# Patient Record
Sex: Male | Born: 1985 | Race: White | Hispanic: No | Marital: Single | Smoking: Current every day smoker
Health system: Southern US, Community
[De-identification: ages and names within clinical notes are randomized; demographics above are authoritative.]

## PROBLEM LIST (undated history)

## (undated) DIAGNOSIS — K229 Disease of esophagus, unspecified: Secondary | ICD-10-CM

## (undated) DIAGNOSIS — K2270 Barrett's esophagus without dysplasia: Secondary | ICD-10-CM

## (undated) DIAGNOSIS — R569 Unspecified convulsions: Secondary | ICD-10-CM

## (undated) DIAGNOSIS — F319 Bipolar disorder, unspecified: Secondary | ICD-10-CM

## (undated) DIAGNOSIS — K449 Diaphragmatic hernia without obstruction or gangrene: Secondary | ICD-10-CM

## (undated) DIAGNOSIS — IMO0002 Reserved for concepts with insufficient information to code with codable children: Secondary | ICD-10-CM

## (undated) HISTORY — PX: KNEE SURGERY: SHX244

## (undated) HISTORY — PX: ESOPHAGOSCOPY: SUR460

---
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
## 2005-02-11 ENCOUNTER — Emergency Department (HOSPITAL_COMMUNITY): Admission: EM | Admit: 2005-02-11 | Discharge: 2005-02-11 | Payer: Self-pay | Admitting: Emergency Medicine

## 2006-07-11 ENCOUNTER — Emergency Department (HOSPITAL_COMMUNITY): Admission: EM | Admit: 2006-07-11 | Discharge: 2006-07-11 | Payer: Self-pay | Admitting: Emergency Medicine

## 2006-11-09 ENCOUNTER — Ambulatory Visit (HOSPITAL_COMMUNITY): Payer: Self-pay | Admitting: Psychiatry

## 2006-11-29 ENCOUNTER — Ambulatory Visit (HOSPITAL_COMMUNITY): Payer: Self-pay | Admitting: Psychiatry

## 2006-12-11 ENCOUNTER — Ambulatory Visit (HOSPITAL_COMMUNITY): Payer: Self-pay | Admitting: Psychiatry

## 2007-01-02 ENCOUNTER — Ambulatory Visit (HOSPITAL_COMMUNITY): Payer: Self-pay | Admitting: Psychiatry

## 2007-01-12 ENCOUNTER — Emergency Department (HOSPITAL_COMMUNITY): Admission: EM | Admit: 2007-01-12 | Discharge: 2007-01-12 | Payer: Self-pay | Admitting: Emergency Medicine

## 2007-02-28 ENCOUNTER — Ambulatory Visit (HOSPITAL_COMMUNITY): Payer: Self-pay | Admitting: Psychiatry

## 2007-03-28 ENCOUNTER — Ambulatory Visit (HOSPITAL_COMMUNITY): Payer: Self-pay | Admitting: Psychiatry

## 2007-07-09 ENCOUNTER — Telehealth: Payer: Self-pay | Admitting: Gastroenterology

## 2007-07-11 ENCOUNTER — Emergency Department (HOSPITAL_COMMUNITY): Admission: EM | Admit: 2007-07-11 | Discharge: 2007-07-11 | Payer: Self-pay | Admitting: Emergency Medicine

## 2007-08-15 ENCOUNTER — Ambulatory Visit (HOSPITAL_COMMUNITY): Payer: Self-pay | Admitting: Psychiatry

## 2008-01-27 ENCOUNTER — Encounter: Admission: RE | Admit: 2008-01-27 | Discharge: 2008-01-27 | Payer: Self-pay | Admitting: Family Medicine

## 2008-04-08 ENCOUNTER — Emergency Department (HOSPITAL_COMMUNITY): Admission: EM | Admit: 2008-04-08 | Discharge: 2008-04-08 | Payer: Self-pay | Admitting: Emergency Medicine

## 2009-01-04 ENCOUNTER — Ambulatory Visit: Payer: Self-pay | Admitting: Diagnostic Radiology

## 2009-01-04 ENCOUNTER — Emergency Department (HOSPITAL_BASED_OUTPATIENT_CLINIC_OR_DEPARTMENT_OTHER): Admission: EM | Admit: 2009-01-04 | Discharge: 2009-01-04 | Payer: Self-pay | Admitting: Emergency Medicine

## 2009-01-13 ENCOUNTER — Encounter: Admission: RE | Admit: 2009-01-13 | Discharge: 2009-01-13 | Payer: Self-pay | Admitting: Family Medicine

## 2009-05-25 ENCOUNTER — Ambulatory Visit (HOSPITAL_COMMUNITY): Payer: Self-pay | Admitting: Psychiatry

## 2009-07-30 ENCOUNTER — Encounter: Admission: RE | Admit: 2009-07-30 | Discharge: 2009-07-30 | Payer: Self-pay | Admitting: Gastroenterology

## 2009-12-15 ENCOUNTER — Ambulatory Visit (HOSPITAL_COMMUNITY): Payer: Self-pay | Admitting: Psychiatry

## 2010-02-27 ENCOUNTER — Encounter: Payer: Self-pay | Admitting: Family Medicine

## 2010-03-10 NOTE — Progress Notes (Signed)
Summary: TRIAGE  Phone Note From Other Clinic Call back at [phone]   Caller: GLORIA DR [facility] Call For: ANY [facility] Reason for Call: Schedule Patient Appt Summary of Call: PT HAVING A LOT OF EPIGASTRIC PAIN AND DYSPHAGIA ONLY ABLE TO SWALLOW A LITTLE BIT OF WATER  DR SHAW WOULD LIKE HIM SEEN SOONER THAN 1ST AVAIL.Marland KitchenSCHEDULED HIM FOR 6-24 W/ DR Russella Dar... Initial call taken by: Tawni Levy,  July 09, 2007 9:08 AM  Follow-up for Phone Call        Called pt and he is having difficulty swallowing some foods which feels like some things gets stuck in his throat.  Dr Clelia Croft did start him on Nexium yesterday 07-08-07.  Pt also has some epigastric discomfort.  He doesn't know our doctors here so he really doesn't have a preference.  I told him even though he has an appt we made for him for 07-31-07 w/ Dr Russella Dar I would see if I could get him in any sooner.  Until  I get back to him with sooner appt, I  advised he cuts his meat in smaller peices and chews his food longer before swallowing. I suggested soft foods opposed to hard crunchy foods.  Lowry Ram CMA   Additional Follow-up for Phone Call Additional follow up Details #1::        Pt scheduled for appt 07-17-07 2:30 with Dr Russella Dar Additional Follow-up by: Darcey Nora RN,  July 09, 2007 2:00 PM

## 2010-05-19 LAB — POCT I-STAT, CHEM 8
BUN: 10 mg/dL (ref 6–23)
Calcium, Ion: 1.12 mmol/L (ref 1.12–1.32)
Chloride: 106 mEq/L (ref 96–112)
Creatinine, Ser: 1.3 mg/dL (ref 0.4–1.5)
Glucose, Bld: 103 mg/dL — ABNORMAL HIGH (ref 70–99)
HCT: 48 % (ref 39.0–52.0)
Hemoglobin: 16.3 g/dL (ref 13.0–17.0)
Potassium: 3.8 mEq/L (ref 3.5–5.1)
Sodium: 140 mEq/L (ref 135–145)
TCO2: 24 mmol/L (ref 0–100)

## 2010-06-07 ENCOUNTER — Encounter (HOSPITAL_COMMUNITY): Payer: Self-pay | Admitting: Physician Assistant

## 2010-06-21 NOTE — Consult Note (Signed)
NAMESTEPHFON, BOVEY NO.:  [id number]   MEDICAL RECORD NO.:  [id number]          PATIENT TYPE:  EMS   LOCATION:  ED                           FACILITY:  [facility]   PHYSICIAN:  Madelynn Done, MD  DATE OF BIRTH:  [birth year]   DATE OF CONSULTATION:  07/11/2006  DATE OF DISCHARGE:                                 CONSULTATION   REASON FOR CONSULTATION:  Right hand injury.   REQUESTING PHYSICIAN:  ER doctor, Shelda Jakes, M.D.   HISTORY OF PRESENT ILLNESS:  Mr. Prusinski is a 25 year old ambidextrous  white male who on a stationary object sustained an injury to his right  hand.  The patient presented to the emergency department at [facility]  with pain and some deformity to the right hand.  I was consulted for the  management of his right hand injury.  The patient complained of pain in  the right hand, finger, and small finger and decreased mobility in the  two digits.   PAST MEDICAL HISTORY:  Bipolar disorder.   PAST SURGICAL HISTORY:  Three surgeries on the right knee by Dr. August Saucer at  [facility].   MEDICATIONS:  He denies any.   ALLERGIES:  None.   SOCIAL HISTORY:  He does smoke.  He denies any alcohol use.  Occasional  marijuana.  He works at a [employer].  [place]   REVIEW OF SYSTEMS:  No recent illnesses.  No hospitalization.   PHYSICAL EXAMINATION:  GENERAL:  He is a healthy-appearing white male in  no acute distress.  VITAL SIGNS:  Temperature 97.1, heart rate 64, blood pressure 113/74.  EXTREMITIES:  On examination of the right hand, he has a small abrasion  between the webspace of his long finger and ring finger.  There are no  open wounds on the dorsum or the volar aspect of the hand.  He has an  obvious deformity at the base of his small finger and long finger  metacarpal.  He has decreased motion of those two digits.  He has full  motion and no tenderness over the index and long finger  metacarpal.  He  is able to make the a-okay.  He is able to cross his fingers.  He is  able to extend his thumb from a palm flat position.  His hand is well  perfused with good radial pulse.  Sensation to light touch is present to  the tips  in all digits.  Normal muscle tone in the hand.  No other  areas of trauma to the upper extremity.   On exam radiograph, AP, lateral and oblique films of the right hand do  show the dislocation to the fourth and fifth CMP joint.  It does appear  there is a fracture at the base, what appears to be the fourth  metacarpal.   PROCEDURE:  The right hand was prepped with alcohol, 10 cc of 1%  lidocaine without epi was then used to anesthetize the skin.  An IV  Dilaudid medication was used for pain and  using the finger trap  traction, 10 pounds, and then just a manual closed reduction was  performed with an audible clunk with reduction.  A well-padded clam-  digger type of splint was then applied to the hand.  It was well molded  with a good dorsal mold over the base of the small finger and ring  finger CMC joints.  The patient tolerated the procedure well.   Post-reduction radiographs do show the reduction of the fourth and fifth  CMC joints.  There appears to be a fracture at the base of the ring  finger metacarpals.  It does not appear displaced after the reduction.   IMPRESSION:  Right fourth and fifth carpal metacarpal joint dislocation  to the fracture of the base of the fourth metacarpal.   PLAN:  We will continue to treat this in a closed fashion.  We talked  about the stability for the potential for instability of this injury and  if he does redislocate or there is evidence of subluxation clinically,  he may require open reduction versus percutaneous skeletal fixation.  I  plan to see him back in one week.  At the one week mark, we will x-ray  him in the splint.  If he maintains the reduction, we will continue with  the closed treatment.  The  patient's questions were answered.  He was  given a prescription for Percocet.  This plan was discussed with the  patient's mother, who was present for the entirety.  All questions were  addressed today to keep his hand elevated and do not remove the splint.      Madelynn Done, MD  Electronically Signed     FWO/MEDQ  D:  07/11/2006  T:  07/12/2006  Job:  [id number]

## 2010-07-12 ENCOUNTER — Encounter (HOSPITAL_COMMUNITY): Payer: BC Managed Care – HMO | Admitting: Physician Assistant

## 2010-07-12 DIAGNOSIS — F39 Unspecified mood [affective] disorder: Secondary | ICD-10-CM

## 2010-08-12 ENCOUNTER — Emergency Department (HOSPITAL_COMMUNITY)
Admission: EM | Admit: 2010-08-12 | Discharge: 2010-08-12 | Disposition: A | Discharge status: Home / Self Care | Payer: BC Managed Care – HMO | Attending: Emergency Medicine | Admitting: Emergency Medicine

## 2010-08-12 ENCOUNTER — Emergency Department (HOSPITAL_COMMUNITY): Payer: BC Managed Care – HMO

## 2010-08-12 DIAGNOSIS — F17200 Nicotine dependence, unspecified, uncomplicated: Secondary | ICD-10-CM | POA: Insufficient documentation

## 2010-08-12 DIAGNOSIS — F319 Bipolar disorder, unspecified: Secondary | ICD-10-CM | POA: Insufficient documentation

## 2010-08-12 DIAGNOSIS — R1011 Right upper quadrant pain: Secondary | ICD-10-CM | POA: Insufficient documentation

## 2010-08-12 DIAGNOSIS — G40909 Epilepsy, unspecified, not intractable, without status epilepticus: Secondary | ICD-10-CM | POA: Insufficient documentation

## 2010-08-12 DIAGNOSIS — K219 Gastro-esophageal reflux disease without esophagitis: Secondary | ICD-10-CM | POA: Insufficient documentation

## 2010-08-12 DIAGNOSIS — F209 Schizophrenia, unspecified: Secondary | ICD-10-CM | POA: Insufficient documentation

## 2010-08-12 DIAGNOSIS — R1110 Vomiting, unspecified: Secondary | ICD-10-CM | POA: Insufficient documentation

## 2010-08-12 DIAGNOSIS — K2970 Gastritis, unspecified, without bleeding: Secondary | ICD-10-CM | POA: Insufficient documentation

## 2010-08-12 DIAGNOSIS — Z79899 Other long term (current) drug therapy: Secondary | ICD-10-CM | POA: Insufficient documentation

## 2010-08-12 DIAGNOSIS — K2990 Gastroduodenitis, unspecified, without bleeding: Secondary | ICD-10-CM | POA: Insufficient documentation

## 2010-08-12 LAB — CBC
HCT: 42.9 % (ref 39.0–52.0)
Hemoglobin: 15.4 g/dL (ref 13.0–17.0)
MCH: 30.6 pg (ref 26.0–34.0)
MCHC: 35.9 g/dL (ref 30.0–36.0)
MCV: 85.3 fL (ref 78.0–100.0)
Platelets: 260 10*3/uL (ref 150–400)
RBC: 5.03 MIL/uL (ref 4.22–5.81)
RDW: 12.3 % (ref 11.5–15.5)
WBC: 10.2 10*3/uL (ref 4.0–10.5)

## 2010-08-12 LAB — URINALYSIS, ROUTINE W REFLEX MICROSCOPIC
Bilirubin Urine: NEGATIVE
Glucose, UA: NEGATIVE mg/dL
Hgb urine dipstick: NEGATIVE
Ketones, ur: NEGATIVE mg/dL
Leukocytes, UA: NEGATIVE
Nitrite: NEGATIVE
Protein, ur: NEGATIVE mg/dL
Specific Gravity, Urine: 1.014 (ref 1.005–1.030)
Urobilinogen, UA: 1 mg/dL (ref 0.0–1.0)
pH: 6 (ref 5.0–8.0)

## 2010-08-12 LAB — DIFFERENTIAL
Basophils Absolute: 0.1 10*3/uL (ref 0.0–0.1)
Basophils Relative: 1 % (ref 0–1)
Eosinophils Absolute: 0.2 10*3/uL (ref 0.0–0.7)
Eosinophils Relative: 2 % (ref 0–5)
Lymphocytes Relative: 27 % (ref 12–46)
Lymphs Abs: 2.8 10*3/uL (ref 0.7–4.0)
Monocytes Absolute: 0.7 10*3/uL (ref 0.1–1.0)
Monocytes Relative: 7 % (ref 3–12)
Neutro Abs: 6.5 10*3/uL (ref 1.7–7.7)
Neutrophils Relative %: 63 % (ref 43–77)

## 2010-08-12 LAB — COMPREHENSIVE METABOLIC PANEL
ALT: 16 U/L (ref 0–53)
AST: 18 U/L (ref 0–37)
Albumin: 3.8 g/dL (ref 3.5–5.2)
Alkaline Phosphatase: 63 U/L (ref 39–117)
BUN: 13 mg/dL (ref 6–23)
CO2: 22 mEq/L (ref 19–32)
Calcium: 9.2 mg/dL (ref 8.4–10.5)
Chloride: 107 mEq/L (ref 96–112)
Creatinine, Ser: 0.96 mg/dL (ref 0.50–1.35)
GFR calc Af Amer: >60 mL/min (ref >60)
GFR calc non Af Amer: >60 mL/min (ref >60)
Glucose, Bld: 105 mg/dL — ABNORMAL HIGH (ref 70–99)
Potassium: 3.4 mEq/L — ABNORMAL LOW (ref 3.5–5.1)
Sodium: 139 mEq/L (ref 135–145)
Total Bilirubin: 0.4 mg/dL (ref 0.3–1.2)
Total Protein: 7 g/dL (ref 6.0–8.3)

## 2010-08-12 LAB — LIPASE, BLOOD: Lipase: 24 U/L (ref 11–59)

## 2010-08-18 ENCOUNTER — Encounter (HOSPITAL_COMMUNITY): Payer: BC Managed Care – HMO | Admitting: Physician Assistant

## 2010-08-18 DIAGNOSIS — F309 Manic episode, unspecified: Secondary | ICD-10-CM

## 2010-09-06 ENCOUNTER — Encounter (HOSPITAL_COMMUNITY): Payer: BC Managed Care – HMO | Admitting: Physician Assistant

## 2010-09-06 DIAGNOSIS — F39 Unspecified mood [affective] disorder: Secondary | ICD-10-CM

## 2010-09-06 DIAGNOSIS — F29 Unspecified psychosis not due to a substance or known physiological condition: Secondary | ICD-10-CM

## 2010-10-05 ENCOUNTER — Encounter (HOSPITAL_COMMUNITY): Payer: BC Managed Care – HMO | Admitting: Physician Assistant

## 2010-11-03 LAB — COMPREHENSIVE METABOLIC PANEL
ALT: 20
AST: 25
Albumin: 3.3 — ABNORMAL LOW
Alkaline Phosphatase: 58
BUN: 14
CO2: 23
Calcium: 9.1
Chloride: 103
Creatinine, Ser: 1.17
GFR calc Af Amer: >60
GFR calc non Af Amer: >60
Glucose, Bld: 97
Potassium: 4.1
Sodium: 137
Total Bilirubin: 1
Total Protein: 6.6

## 2010-11-03 LAB — URINALYSIS, ROUTINE W REFLEX MICROSCOPIC
Bilirubin Urine: NEGATIVE
Glucose, UA: NEGATIVE
Hgb urine dipstick: NEGATIVE
Ketones, ur: 40 — AB
Nitrite: NEGATIVE
Protein, ur: NEGATIVE
Specific Gravity, Urine: 1.025
Urobilinogen, UA: 0.2
pH: 5

## 2010-11-03 LAB — CBC
HCT: 46.2
Hemoglobin: 16
MCHC: 34.7
MCV: 87.3
Platelets: 219
RBC: 5.29
RDW: 12
WBC: 10.1

## 2010-11-03 LAB — DIFFERENTIAL
Basophils Absolute: 0
Basophils Relative: 0
Eosinophils Absolute: 0.2
Eosinophils Relative: 3
Lymphocytes Relative: 15
Lymphs Abs: 1.5
Monocytes Absolute: 1
Monocytes Relative: 10
Neutro Abs: 7.3
Neutrophils Relative %: 72

## 2010-11-03 LAB — LIPASE, BLOOD: Lipase: 18

## 2010-11-14 LAB — DIFFERENTIAL
Basophils Absolute: 0.1
Basophils Relative: 0
Eosinophils Absolute: 0 — ABNORMAL LOW
Eosinophils Relative: 0
Lymphocytes Relative: 10 — ABNORMAL LOW
Lymphs Abs: 1.8
Monocytes Absolute: 0.9
Monocytes Relative: 5
Neutro Abs: 16.4 — ABNORMAL HIGH
Neutrophils Relative %: 86 — ABNORMAL HIGH

## 2010-11-14 LAB — URINALYSIS, ROUTINE W REFLEX MICROSCOPIC
Bilirubin Urine: NEGATIVE
Glucose, UA: NEGATIVE
Hgb urine dipstick: NEGATIVE
Ketones, ur: NEGATIVE
Leukocytes, UA: NEGATIVE
Nitrite: NEGATIVE
Protein, ur: 30 — AB
Specific Gravity, Urine: 1.019
Urobilinogen, UA: 0.2
pH: 5.5

## 2010-11-14 LAB — RAPID URINE DRUG SCREEN, HOSP PERFORMED
Amphetamines: NOT DETECTED
Barbiturates: NOT DETECTED
Benzodiazepines: NOT DETECTED
Cocaine: NOT DETECTED
Opiates: NOT DETECTED
Tetrahydrocannabinol: POSITIVE — AB

## 2010-11-14 LAB — CBC
HCT: 45.2
Hemoglobin: 15.8
MCHC: 34.9
MCV: 88.7
Platelets: 289
RBC: 5.1
RDW: 12.8
WBC: 19.2 — ABNORMAL HIGH

## 2010-11-14 LAB — BASIC METABOLIC PANEL
BUN: 11
CO2: 24
Calcium: 9.3
Chloride: 108
Creatinine, Ser: 0.98
GFR calc Af Amer: >60
GFR calc non Af Amer: >60
Glucose, Bld: 132 — ABNORMAL HIGH
Potassium: 3.6
Sodium: 139

## 2010-11-14 LAB — URINE MICROSCOPIC-ADD ON

## 2010-11-15 ENCOUNTER — Encounter (HOSPITAL_COMMUNITY): Payer: BC Managed Care – HMO | Admitting: Physician Assistant

## 2010-11-15 DIAGNOSIS — F39 Unspecified mood [affective] disorder: Secondary | ICD-10-CM

## 2011-01-02 ENCOUNTER — Other Ambulatory Visit (HOSPITAL_COMMUNITY): Payer: Self-pay | Admitting: Physician Assistant

## 2011-01-02 DIAGNOSIS — F39 Unspecified mood [affective] disorder: Secondary | ICD-10-CM

## 2011-01-03 ENCOUNTER — Other Ambulatory Visit (HOSPITAL_COMMUNITY): Payer: Self-pay | Admitting: Physician Assistant

## 2011-01-03 DIAGNOSIS — F39 Unspecified mood [affective] disorder: Secondary | ICD-10-CM

## 2011-02-16 ENCOUNTER — Ambulatory Visit (INDEPENDENT_AMBULATORY_CARE_PROVIDER_SITE_OTHER): Payer: BC Managed Care – PPO | Admitting: Physician Assistant

## 2011-02-16 DIAGNOSIS — F39 Unspecified mood [affective] disorder: Secondary | ICD-10-CM

## 2011-02-16 NOTE — Progress Notes (Signed)
   [facility] Follow-up Outpatient Visit  Keith Graves 11-14-1985  Date:  02/16/11   Subjective: Keith Graves presents today to follow up on his medications for mood and auditory hallucinations. When he was last seen in October of 2012. He had discontinued taking Zyprexa due to the cost. Since that time. He has lost in excess of 20 pounds. This weight loss is not bothersome to him. He reports that his mood has been stable. His sleep and appetite are good. He denies any suicidal or homicidal ideation. He denies any visual hallucinations. He continues to have some auditory hallucinations. He expresses some anxiety do to the fact that his father is extremely ill and has been hospitalized about 6 times in the last 2-3 months. Keith Graves reports that he was rehired for the job that he had been laid off from  There were no vitals filed for this visit.  Mental Status Examination  Appearance: Casual Alert: Yes Attention: good  Cooperative: Yes Eye Contact: Good Speech: Clear and even Psychomotor Activity: Normal Memory/Concentration: Intact Oriented: person, place, time/date and situation Mood: Euthymic Affect: Congruent Thought Processes and Associations: Linear Fund of Knowledge: Good Thought Content:  Insight: Good Judgement: Good  Diagnosis: Mood disorder, not otherwise specified  Treatment Plan: Continue Lamictal at 200 mg daily and followup in 3 months  Sofia Jaquith, PA

## 2011-03-09 ENCOUNTER — Other Ambulatory Visit (HOSPITAL_COMMUNITY): Payer: Self-pay | Admitting: Physician Assistant

## 2011-04-27 ENCOUNTER — Emergency Department (HOSPITAL_BASED_OUTPATIENT_CLINIC_OR_DEPARTMENT_OTHER)
Admission: EM | Admit: 2011-04-27 | Discharge: 2011-04-27 | Disposition: A | Discharge status: Home / Self Care | Payer: Self-pay | Attending: Emergency Medicine | Admitting: Emergency Medicine

## 2011-04-27 ENCOUNTER — Encounter (HOSPITAL_BASED_OUTPATIENT_CLINIC_OR_DEPARTMENT_OTHER): Payer: Self-pay | Admitting: *Deleted

## 2011-04-27 ENCOUNTER — Emergency Department (INDEPENDENT_AMBULATORY_CARE_PROVIDER_SITE_OTHER): Payer: Self-pay

## 2011-04-27 DIAGNOSIS — H53149 Visual discomfort, unspecified: Secondary | ICD-10-CM | POA: Insufficient documentation

## 2011-04-27 DIAGNOSIS — R110 Nausea: Secondary | ICD-10-CM

## 2011-04-27 DIAGNOSIS — K219 Gastro-esophageal reflux disease without esophagitis: Secondary | ICD-10-CM | POA: Insufficient documentation

## 2011-04-27 DIAGNOSIS — R5381 Other malaise: Secondary | ICD-10-CM | POA: Insufficient documentation

## 2011-04-27 DIAGNOSIS — R51 Headache: Secondary | ICD-10-CM

## 2011-04-27 DIAGNOSIS — H539 Unspecified visual disturbance: Secondary | ICD-10-CM

## 2011-04-27 DIAGNOSIS — K2270 Barrett's esophagus without dysplasia: Secondary | ICD-10-CM | POA: Insufficient documentation

## 2011-04-27 DIAGNOSIS — Z79899 Other long term (current) drug therapy: Secondary | ICD-10-CM | POA: Insufficient documentation

## 2011-04-27 DIAGNOSIS — H538 Other visual disturbances: Secondary | ICD-10-CM | POA: Insufficient documentation

## 2011-04-27 DIAGNOSIS — G40909 Epilepsy, unspecified, not intractable, without status epilepticus: Secondary | ICD-10-CM | POA: Insufficient documentation

## 2011-04-27 DIAGNOSIS — H5710 Ocular pain, unspecified eye: Secondary | ICD-10-CM

## 2011-04-27 HISTORY — DX: Disease of esophagus, unspecified: K22.9

## 2011-04-27 HISTORY — DX: Barrett's esophagus without dysplasia: K22.70

## 2011-04-27 HISTORY — DX: Diaphragmatic hernia without obstruction or gangrene: K44.9

## 2011-04-27 HISTORY — DX: Unspecified convulsions: R56.9

## 2011-04-27 MED ORDER — DEXAMETHASONE SODIUM PHOSPHATE 10 MG/ML IJ SOLN
10.0000 mg | Freq: Once | INTRAMUSCULAR | Status: AC
  Administered 2011-04-27: 10 mg via INTRAVENOUS
  Filled 2011-04-27: qty 1

## 2011-04-27 MED ORDER — DIPHENHYDRAMINE HCL 50 MG/ML IJ SOLN
25.0000 mg | Freq: Once | INTRAMUSCULAR | Status: AC
  Administered 2011-04-27: 25 mg via INTRAVENOUS
  Filled 2011-04-27: qty 1

## 2011-04-27 MED ORDER — BUTALBITAL-APAP-CAFFEINE 50-325-40 MG PO TABS: 1.0000 | ORAL_TABLET | Freq: Four times a day (QID) | ORAL | Status: AC | PRN

## 2011-04-27 MED ORDER — KETOROLAC TROMETHAMINE 30 MG/ML IJ SOLN
30.0000 mg | Freq: Once | INTRAMUSCULAR | Status: AC
  Administered 2011-04-27: 30 mg via INTRAVENOUS
  Filled 2011-04-27: qty 1

## 2011-04-27 MED ORDER — METOCLOPRAMIDE HCL 5 MG/ML IJ SOLN
20.0000 mg | Freq: Once | INTRAVENOUS | Status: AC
  Administered 2011-04-27: 20 mg via INTRAVENOUS
  Filled 2011-04-27: qty 4

## 2011-04-27 MED ORDER — SODIUM CHLORIDE 0.9 % IV BOLUS (SEPSIS)
1000.0000 mL | Freq: Once | INTRAVENOUS | Status: AC
  Administered 2011-04-27: 1000 mL via INTRAVENOUS

## 2011-04-27 NOTE — ED Notes (Signed)
Pt states HA is better and ready to go

## 2011-04-27 NOTE — ED Provider Notes (Signed)
History     CSN: [id number]  Arrival date & time 04/27/11  1055   First MD Initiated Contact with Patient 04/27/11 1151      Chief Complaint  Patient presents with  . Headache    HPI Patient w Hx of migraines now p/w HA.  He notes that this pain began gradually approximately 3 weeks ago.  Since onset he has had persistent pain focally about the right side of his face.  The pain is both temporal and retro-orbital.  He notes photophobia, generalized weakness, intermittent blurry vision.  The pain is similar, but different from prior migraines and that it is more severe, and of longer duration.  No relief with oral medications at home.  No fevers, chills.  He does complain of nausea and vomiting secondary to the pain. Past Medical History  Diagnosis Date  . Barrett's esophagus   . Esophageal disease   . Hernia, hiatal   . Seizures     Past Surgical History  Procedure Date  . Knee surgery   . Esophagoscopy     biopsy    No family history on file.  History  Substance Use Topics  . Smoking status: Current Everyday Smoker -- 1.0 packs/day for 10 years    Types: Cigarettes  . Smokeless tobacco: Not on file  . Alcohol Use: No      Review of Systems  Constitutional:       Per HPI, otherwise negative  HENT:       Per HPI, otherwise negative  Eyes: Negative.   Respiratory:       Per HPI, otherwise negative  Cardiovascular:       Per HPI, otherwise negative  Gastrointestinal: Positive for nausea and vomiting.  Genitourinary: Negative.   Musculoskeletal:       Per HPI, otherwise negative  Skin: Negative.   Neurological: Positive for weakness and headaches. Negative for tremors, seizures, syncope, speech difficulty, light-headedness and numbness.    Allergies  Hydrocodone  Home Medications   Current Outpatient Rx  Name Route Sig Dispense Refill  . DEXILANT PO Oral Take by mouth.    . METOCLOPRAMIDE HCL 10 MG PO TABS Oral Take 10 mg by mouth 4 (four) times daily.     Marland Kitchen LAMOTRIGINE 200 MG PO TABS  TAKE 1 TABLET EVERY DAY 90 tablet 0    BP 147/80  Pulse 111  Temp(Src) 98.2 F (36.8 C) (Oral)  Resp 20  Ht 5\' 10"  (1.778 m)  Wt 150 lb (68.04 kg)  BMI 21.52 kg/m2  SpO2 100%  Physical Exam  Nursing note and vitals reviewed. Constitutional: He is oriented to person, place, and time. He appears well-developed. No distress.  HENT:  Head: Normocephalic and atraumatic.  Eyes: Conjunctivae and EOM are normal. Pupils are equal, round, and reactive to light.       Pain elicited w light into R eye  Cardiovascular: Normal rate and regular rhythm.   Pulmonary/Chest: Effort normal. No stridor. No respiratory distress.  Abdominal: He exhibits no distension.  Musculoskeletal: He exhibits no edema.  Neurological: He is alert and oriented to person, place, and time. He has normal strength. No cranial nerve deficit or sensory deficit. Coordination and gait normal.  Skin: Skin is warm and dry.  Psychiatric: He has a normal mood and affect.    ED Course  Procedures (including critical care time)  Labs Reviewed - No data to display No results found.   No diagnosis found.  CT reviewed by  me    MDM  This young male with a history of migraine headaches now presents with a headache it is characteristically different and ulnar duration than his typical headaches.  Notably, the patient has only been trying OTC nonnarcotic medications for control without any relief.  My exam the patient is in no distress, with no focal neurologic deficits.  The patient's vital signs are unremarkable aside from mild tachycardia.  Following provision of fluids and analgesics, the patient noted a significant decrease in his symptoms.  He was sleeping seemingly comfortably.  The patient was discharged in stable condition to follow up with neurology.  With greater than 2 weeks of symptoms, the absence of focal neurologic deficits or any distress, there is low suspicion for either bleed or  new mass.  The unremarkable CT his reassuring for the absence of these phenomena.    Gerhard Munch, MD 04/27/11 1410

## 2011-04-27 NOTE — ED Notes (Signed)
Patient states he has had a headache for the last three weeks.  Is having some blurry vision on the right.  States he has a history of migraines, last one being 5 years ago.  States the pain is the worst pain he has ever had with a migraine. Symptoms have been associated with nausea and he treated the nausea with promethazine.

## 2011-05-18 ENCOUNTER — Ambulatory Visit (HOSPITAL_COMMUNITY): Payer: BC Managed Care – PPO | Admitting: Physician Assistant

## 2011-06-07 ENCOUNTER — Other Ambulatory Visit (HOSPITAL_COMMUNITY): Payer: Self-pay | Admitting: *Deleted

## 2011-06-08 ENCOUNTER — Other Ambulatory Visit (HOSPITAL_COMMUNITY): Payer: Self-pay | Admitting: *Deleted

## 2011-06-08 DIAGNOSIS — F39 Unspecified mood [affective] disorder: Secondary | ICD-10-CM

## 2011-06-08 MED ORDER — LAMOTRIGINE 200 MG PO TABS: 200.0000 mg | ORAL_TABLET | Freq: Every day | ORAL | Status: DC

## 2012-02-20 ENCOUNTER — Telehealth (HOSPITAL_COMMUNITY): Payer: Self-pay

## 2012-02-20 NOTE — Telephone Encounter (Signed)
1:33PM 02/20/12 S/W PT' S MOTHER - INFORMED HER PER ALAN -PROVIDER THE PT NEED TO GO TO THE HOSPITAL - PT'S MOTHER REPLIED "HE IS NOT A DANGER CAN ALAN JUST TALK WITH HIM" - REPLIED TO THE MOTHER THAT HE CAN BE SEEN BUT WOULD HAVE TO MAKE AN APPT - THE PT'S MOTHER WANTED TO KNOW HOW SOON ALAN-PROVIDER COULD SEE HER SON - REPLIED "03/13/12 IS THE NEXT NP APPT BUT THE PATIENT WOULD HAVE TO CALL AND MAKE THE APPT"  MOTHER REPLIED " WE WILL FIND ANOTHER PROVDIER./SH

## 2012-02-28 ENCOUNTER — Ambulatory Visit: Payer: Self-pay | Admitting: Internal Medicine

## 2012-03-15 ENCOUNTER — Ambulatory Visit: Payer: Self-pay | Admitting: Internal Medicine

## 2012-10-07 ENCOUNTER — Encounter (HOSPITAL_BASED_OUTPATIENT_CLINIC_OR_DEPARTMENT_OTHER): Payer: Self-pay

## 2012-10-07 ENCOUNTER — Emergency Department (HOSPITAL_BASED_OUTPATIENT_CLINIC_OR_DEPARTMENT_OTHER)
Admission: EM | Admit: 2012-10-07 | Discharge: 2012-10-07 | Disposition: A | Discharge status: Home / Self Care | Payer: Self-pay | Attending: Emergency Medicine | Admitting: Emergency Medicine

## 2012-10-07 DIAGNOSIS — Z79899 Other long term (current) drug therapy: Secondary | ICD-10-CM | POA: Insufficient documentation

## 2012-10-07 DIAGNOSIS — F17200 Nicotine dependence, unspecified, uncomplicated: Secondary | ICD-10-CM | POA: Insufficient documentation

## 2012-10-07 DIAGNOSIS — M549 Dorsalgia, unspecified: Secondary | ICD-10-CM | POA: Insufficient documentation

## 2012-10-07 DIAGNOSIS — M25562 Pain in left knee: Secondary | ICD-10-CM

## 2012-10-07 DIAGNOSIS — Z9889 Other specified postprocedural states: Secondary | ICD-10-CM | POA: Insufficient documentation

## 2012-10-07 DIAGNOSIS — M25569 Pain in unspecified knee: Secondary | ICD-10-CM | POA: Insufficient documentation

## 2012-10-07 DIAGNOSIS — K2270 Barrett's esophagus without dysplasia: Secondary | ICD-10-CM | POA: Insufficient documentation

## 2012-10-07 MED ORDER — TRAMADOL HCL 50 MG PO TABS: 50.0000 mg | ORAL_TABLET | Freq: Four times a day (QID) | ORAL | Status: DC | PRN

## 2012-10-07 NOTE — ED Provider Notes (Signed)
CSN: [id number]     Arrival date & time 10/07/12  1017 History   First MD Initiated Contact with Patient 10/07/12 1032     Chief Complaint  Patient presents with  . Knee Pain  . Back Pain   (Consider location/radiation/quality/duration/timing/severity/associated sxs/prior Treatment) HPI Comments: Pt states that he has been having problems with his left knee dislocating:pt states that it does it spontaneously:pt states that he has similar problems on the right and he had to have surgery:pt states that he has not fallen:pt states that he is here because he needs to prove the need for medicaid:pt denies need for x-ray:no numbness or weakness;pt able to walk without any problem  The history is provided by the patient. No language interpreter was used.    Past Medical History  Diagnosis Date  . Barrett's esophagus   . Esophageal disease   . Hernia, hiatal   . Seizures    Past Surgical History  Procedure Laterality Date  . Knee surgery    . Esophagoscopy      biopsy   No family history on file. History  Substance Use Topics  . Smoking status: Current Some Day Smoker -- 1.00 packs/day for 10 years    Types: Cigarettes  . Smokeless tobacco: Not on file  . Alcohol Use: No    Review of Systems  Constitutional: Negative.   Respiratory: Negative.   Cardiovascular: Negative.     Allergies  Hydrocodone  Home Medications   Current Outpatient Rx  Name  Route  Sig  Dispense  Refill  . Dexlansoprazole (DEXILANT PO)   Oral   Take by mouth.         . lamoTRIgine (LAMICTAL) 200 MG tablet   Oral   Take 1 tablet (200 mg total) by mouth daily.   30 tablet   1     Please ask patient to call for appointment   . metoCLOPramide (REGLAN) 10 MG tablet   Oral   Take 10 mg by mouth 4 (four) times daily.         . traMADol (ULTRAM) 50 MG tablet   Oral   Take 1 tablet (50 mg total) by mouth every 6 (six) hours as needed for pain.   15 tablet   0    BP 122/87  Pulse 73   Temp(Src) 98 F (36.7 C) (Oral)  Resp 18  Ht 5\' 10"  (1.778 m)  Wt 160 lb (72.576 kg)  BMI 22.96 kg/m2  SpO2 100% Physical Exam  Nursing note and vitals reviewed. Constitutional: He is oriented to person, place, and time. He appears well-developed and well-nourished.  HENT:  Head: Normocephalic and atraumatic.  Cardiovascular: Normal rate and regular rhythm.   Pulmonary/Chest: Effort normal and breath sounds normal.  Musculoskeletal:  Pt has full rom of the left knee:no swelling or deformity noted:pt non tender on the back:pt has full rom  Neurological: He is alert and oriented to person, place, and time. Coordination abnormal.  Skin: Skin is warm and dry.  Psychiatric: He has a normal mood and affect.    ED Course  Procedures (including critical care time) Labs Review Labs Reviewed - No data to display Imaging Review No results found.  MDM   1. Knee pain, left   2. Back pain    Pt not having any neuro deficits:pt is okay to follow up with Dr. Pearletha Forge for continued symptoms   Teressa Lower, NP 10/07/12 1055

## 2012-10-07 NOTE — ED Provider Notes (Signed)
Medical screening examination/treatment/procedure(s) were performed by non-physician practitioner and as supervising physician I was immediately available for consultation/collaboration.   Shanna Cisco, MD 10/07/12 [phone]

## 2012-10-07 NOTE — ED Notes (Signed)
Pt reports left knee spontaneously dislocates.  He has left knee pain and back pain.

## 2012-11-23 ENCOUNTER — Encounter (HOSPITAL_COMMUNITY): Payer: Self-pay | Admitting: Emergency Medicine

## 2012-11-23 ENCOUNTER — Emergency Department (HOSPITAL_COMMUNITY)
Admission: EM | Admit: 2012-11-23 | Discharge: 2012-11-23 | Disposition: A | Discharge status: Other Acute Inpatient Hospital | Payer: No Typology Code available for payment source | Attending: Emergency Medicine | Admitting: Emergency Medicine

## 2012-11-23 ENCOUNTER — Encounter (HOSPITAL_COMMUNITY): Payer: Self-pay | Admitting: *Deleted

## 2012-11-23 ENCOUNTER — Inpatient Hospital Stay (HOSPITAL_COMMUNITY)
Admission: AD | Admit: 2012-11-23 | Discharge: 2012-11-29 | DRG: 885 | Disposition: A | Discharge status: Home / Self Care | Payer: Federal, State, Local not specified - Other | Source: Intra-hospital | Attending: Psychiatry | Admitting: Psychiatry

## 2012-11-23 DIAGNOSIS — R45851 Suicidal ideations: Secondary | ICD-10-CM

## 2012-11-23 DIAGNOSIS — IMO0002 Reserved for concepts with insufficient information to code with codable children: Secondary | ICD-10-CM | POA: Insufficient documentation

## 2012-11-23 DIAGNOSIS — F259 Schizoaffective disorder, unspecified: Principal | ICD-10-CM | POA: Diagnosis present

## 2012-11-23 DIAGNOSIS — F3289 Other specified depressive episodes: Secondary | ICD-10-CM | POA: Insufficient documentation

## 2012-11-23 DIAGNOSIS — F332 Major depressive disorder, recurrent severe without psychotic features: Secondary | ICD-10-CM

## 2012-11-23 DIAGNOSIS — F251 Schizoaffective disorder, depressive type: Secondary | ICD-10-CM

## 2012-11-23 DIAGNOSIS — F329 Major depressive disorder, single episode, unspecified: Secondary | ICD-10-CM | POA: Insufficient documentation

## 2012-11-23 DIAGNOSIS — R0602 Shortness of breath: Secondary | ICD-10-CM | POA: Insufficient documentation

## 2012-11-23 DIAGNOSIS — Z8739 Personal history of other diseases of the musculoskeletal system and connective tissue: Secondary | ICD-10-CM | POA: Insufficient documentation

## 2012-11-23 DIAGNOSIS — T1491XA Suicide attempt, initial encounter: Secondary | ICD-10-CM | POA: Diagnosis present

## 2012-11-23 DIAGNOSIS — F17200 Nicotine dependence, unspecified, uncomplicated: Secondary | ICD-10-CM | POA: Insufficient documentation

## 2012-11-23 DIAGNOSIS — K2270 Barrett's esophagus without dysplasia: Secondary | ICD-10-CM | POA: Diagnosis present

## 2012-11-23 DIAGNOSIS — F911 Conduct disorder, childhood-onset type: Secondary | ICD-10-CM | POA: Insufficient documentation

## 2012-11-23 DIAGNOSIS — R062 Wheezing: Secondary | ICD-10-CM | POA: Insufficient documentation

## 2012-11-23 DIAGNOSIS — F1210 Cannabis abuse, uncomplicated: Secondary | ICD-10-CM | POA: Diagnosis present

## 2012-11-23 DIAGNOSIS — G479 Sleep disorder, unspecified: Secondary | ICD-10-CM | POA: Insufficient documentation

## 2012-11-23 DIAGNOSIS — Z8669 Personal history of other diseases of the nervous system and sense organs: Secondary | ICD-10-CM | POA: Insufficient documentation

## 2012-11-23 DIAGNOSIS — Z8719 Personal history of other diseases of the digestive system: Secondary | ICD-10-CM | POA: Insufficient documentation

## 2012-11-23 DIAGNOSIS — Z79899 Other long term (current) drug therapy: Secondary | ICD-10-CM

## 2012-11-23 HISTORY — DX: Reserved for concepts with insufficient information to code with codable children: IMO0002

## 2012-11-23 LAB — COMPREHENSIVE METABOLIC PANEL
ALT: 18 U/L (ref 0–53)
AST: 19 U/L (ref 0–37)
Albumin: 4.2 g/dL (ref 3.5–5.2)
Alkaline Phosphatase: 61 U/L (ref 39–117)
BUN: 13 mg/dL (ref 6–23)
CO2: 22 mEq/L (ref 19–32)
Calcium: 9.9 mg/dL (ref 8.4–10.5)
Chloride: 106 mEq/L (ref 96–112)
Creatinine, Ser: 0.96 mg/dL (ref 0.50–1.35)
GFR calc Af Amer: >90 mL/min (ref >90)
GFR calc non Af Amer: >90 mL/min (ref >90)
Glucose, Bld: 112 mg/dL — ABNORMAL HIGH (ref 70–99)
Potassium: 3.9 mEq/L (ref 3.5–5.1)
Sodium: 139 mEq/L (ref 135–145)
Total Bilirubin: 0.7 mg/dL (ref 0.3–1.2)
Total Protein: 7.7 g/dL (ref 6.0–8.3)

## 2012-11-23 LAB — CBC WITH DIFFERENTIAL/PLATELET
Basophils Absolute: 0.1 10*3/uL (ref 0.0–0.1)
Basophils Relative: 1 % (ref 0–1)
Eosinophils Absolute: 0.4 10*3/uL (ref 0.0–0.7)
Eosinophils Relative: 4 % (ref 0–5)
HCT: 47.6 % (ref 39.0–52.0)
Hemoglobin: 16.6 g/dL (ref 13.0–17.0)
Lymphocytes Relative: 34 % (ref 12–46)
Lymphs Abs: 3.5 10*3/uL (ref 0.7–4.0)
MCH: 30.2 pg (ref 26.0–34.0)
MCHC: 34.9 g/dL (ref 30.0–36.0)
MCV: 86.7 fL (ref 78.0–100.0)
Monocytes Absolute: 0.8 10*3/uL (ref 0.1–1.0)
Monocytes Relative: 8 % (ref 3–12)
Neutro Abs: 5.6 10*3/uL (ref 1.7–7.7)
Neutrophils Relative %: 54 % (ref 43–77)
Platelets: 312 10*3/uL (ref 150–400)
RBC: 5.49 MIL/uL (ref 4.22–5.81)
RDW: 12.5 % (ref 11.5–15.5)
WBC: 10.4 10*3/uL (ref 4.0–10.5)

## 2012-11-23 LAB — RAPID URINE DRUG SCREEN, HOSP PERFORMED
Amphetamines: NOT DETECTED
Barbiturates: NOT DETECTED
Benzodiazepines: NOT DETECTED
Cocaine: NOT DETECTED
Opiates: NOT DETECTED
Tetrahydrocannabinol: POSITIVE — AB

## 2012-11-23 LAB — ETHANOL: Alcohol, Ethyl (B): <11 mg/dL (ref 0–11)

## 2012-11-23 MED ORDER — LORAZEPAM 1 MG PO TABS: 1.0000 mg | ORAL_TABLET | Freq: Three times a day (TID) | ORAL | Status: DC | PRN

## 2012-11-23 MED ORDER — NICOTINE 21 MG/24HR TD PT24
21.0000 mg | MEDICATED_PATCH | Freq: Every day | TRANSDERMAL | Status: DC
  Administered 2012-11-23: 21 mg via TRANSDERMAL
  Filled 2012-11-23: qty 1

## 2012-11-23 MED ORDER — PANTOPRAZOLE SODIUM 40 MG PO TBEC
40.0000 mg | DELAYED_RELEASE_TABLET | Freq: Every day | ORAL | Status: DC
  Administered 2012-11-24 – 2012-11-29 (×6): 40 mg via ORAL
  Filled 2012-11-23 (×9): qty 1

## 2012-11-23 MED ORDER — OLANZAPINE 5 MG PO TBDP
5.0000 mg | ORAL_TABLET | Freq: Two times a day (BID) | ORAL | Status: DC
  Administered 2012-11-23 – 2012-11-24 (×2): 5 mg via ORAL
  Filled 2012-11-23 (×6): qty 1

## 2012-11-23 MED ORDER — LORAZEPAM 1 MG PO TABS
1.0000 mg | ORAL_TABLET | Freq: Once | ORAL | Status: AC
  Administered 2012-11-23: 1 mg via ORAL
  Filled 2012-11-23: qty 1

## 2012-11-23 MED ORDER — ACETAMINOPHEN 325 MG PO TABS: 650.0000 mg | ORAL_TABLET | ORAL | Status: DC | PRN

## 2012-11-23 MED ORDER — LAMOTRIGINE 5 MG PO CHEW
25.0000 mg | CHEWABLE_TABLET | Freq: Two times a day (BID) | ORAL | Status: DC
  Filled 2012-11-23 (×2): qty 5

## 2012-11-23 MED ORDER — ONDANSETRON HCL 4 MG PO TABS: 4.0000 mg | ORAL_TABLET | Freq: Three times a day (TID) | ORAL | Status: DC | PRN

## 2012-11-23 MED ORDER — PANTOPRAZOLE SODIUM 40 MG PO TBEC
40.0000 mg | DELAYED_RELEASE_TABLET | Freq: Every day | ORAL | Status: DC
  Administered 2012-11-23: 40 mg via ORAL
  Filled 2012-11-23: qty 1

## 2012-11-23 MED ORDER — NICOTINE 21 MG/24HR TD PT24
21.0000 mg | MEDICATED_PATCH | Freq: Every day | TRANSDERMAL | Status: DC
  Administered 2012-11-24 – 2012-11-29 (×6): 21 mg via TRANSDERMAL
  Filled 2012-11-23 (×9): qty 1

## 2012-11-23 MED ORDER — ZOLPIDEM TARTRATE 5 MG PO TABS: 5.0000 mg | ORAL_TABLET | Freq: Every evening | ORAL | Status: DC | PRN

## 2012-11-23 MED ORDER — OLANZAPINE 5 MG PO TBDP
5.0000 mg | ORAL_TABLET | Freq: Two times a day (BID) | ORAL | Status: DC
  Administered 2012-11-23: 5 mg via ORAL
  Filled 2012-11-23: qty 1

## 2012-11-23 MED ORDER — ALBUTEROL SULFATE (5 MG/ML) 0.5% IN NEBU
5.0000 mg | INHALATION_SOLUTION | Freq: Once | RESPIRATORY_TRACT | Status: AC
  Administered 2012-11-23: 5 mg via RESPIRATORY_TRACT

## 2012-11-23 MED ORDER — LAMOTRIGINE 25 MG PO TABS
25.0000 mg | ORAL_TABLET | Freq: Two times a day (BID) | ORAL | Status: DC
  Administered 2012-11-23: 25 mg via ORAL
  Filled 2012-11-23 (×2): qty 1

## 2012-11-23 MED ORDER — LORAZEPAM 1 MG PO TABS
1.0000 mg | ORAL_TABLET | Freq: Three times a day (TID) | ORAL | Status: DC | PRN
  Administered 2012-11-24 – 2012-11-26 (×5): 1 mg via ORAL
  Filled 2012-11-23 (×5): qty 1

## 2012-11-23 MED ORDER — MAGNESIUM HYDROXIDE 400 MG/5ML PO SUSP: 30.0000 mL | Freq: Every day | ORAL | Status: DC | PRN

## 2012-11-23 MED ORDER — ALUM & MAG HYDROXIDE-SIMETH 200-200-20 MG/5ML PO SUSP: 30.0000 mL | ORAL | Status: DC | PRN

## 2012-11-23 MED ORDER — ACETAMINOPHEN 325 MG PO TABS
650.0000 mg | ORAL_TABLET | Freq: Four times a day (QID) | ORAL | Status: DC | PRN
  Administered 2012-11-24 – 2012-11-25 (×3): 650 mg via ORAL
  Filled 2012-11-23: qty 2

## 2012-11-23 NOTE — Progress Notes (Signed)
Spoke to [facility] [employer] and alerted her to pt threats to harm others.

## 2012-11-23 NOTE — Consult Note (Signed)
[facility] Face-to-Face Psychiatry Consult   Reason for Consult:  Suicide attempt, homicidal thoughts Referring Physician:  EDP Keith Graves is an 27 y.o. male.  Assessment: AXIS I:  Major Depression, Recurrent severe and Schizoaffective Disorder AXIS II:  Deferred AXIS III:   Past Medical History  Diagnosis Date  . Barrett's esophagus   . Esophageal disease   . Hernia, hiatal   . Seizures   . Degenerative disc disease    AXIS IV:  other psychosocial or environmental problems, problems related to social environment and problems with primary support group AXIS V:  11-20 some danger of hurting self or others possible OR occasionally fails to maintain minimal personal hygiene OR gross impairment in communication  Plan:  Recommend psychiatric Inpatient admission when medically cleared.  Subjective:   Keith Graves is a 27 y.o. male patient admitted with Schizoaffective d/o, Major depressive d/o severe recurrent.  HPI:  This a cocasian who brought self to our ER to be evaluated for anger issues, homicidal thoughts, suicide attempt and auditory hallucination.  Patient has a history of mental illness and sees Williemae Area at our outpatient clinic.  He has not taken any of his Psychiatric medications since his last visit to Mr Hessie Diener last yea January.  Patient was supposed to be taking Zyprexa,  And Lamictal but never filled his prescription because he has no money to buy them.  Patient states instead he uses Marijuana to treat his auditory hallucinations, anger and mood.  He denies drinking or using any other illicit drug and his UDS is positive for Marijuana only. His stressors includes the death of his father last year, pending death of his sick mother, joblessness and financial difficulty.  During this interview patient says he is still homicidal and want to kill some people.  He refused to name who he want to kill but stated " kill bunch of [place] that does not respect me, judge me and stand on my  way"  Patient reports a history suicide attempt by cutting his fore arms and wrist.  Yesterday he attempted another suicide by tying a rope around his neck, tying the other end to a tree and went inside his car to drive off.  He says he stopped at driving off his car so the rope will cut off his neck because of his mother.  He is still endorsing suicide during this interview.  He reports feeling hopeless, helpless and worthless.  He reports sleeping only 2 hours every night and eating once a day.  His affect is flat, labile and anxious.  We will admit him to our inpatient Psychiatric unit for safety and stabilization.  We will start him on an antipsychotic and a mood stabilizer. HPI Elements:   Location:  [facility]. Quality:  SEVERE TO THE EXTENT OF ATTEMPTING SUICIDE BY TYING A ROPE AROUND HIS NECK AND TREE AND ATTEMPTED DRIVING THE CAR TO CUT OFF HIS NECK. Severity:  SEVERE. Timing:  YESTERDAY. Duration:  CHRONIC MENTAL ILLNESS. Context:  MULTIPLE STRESS,; DEATH OF FATHER, PENDING DEATH OF MOTHER AS ARESULT OF ILLNESS, FEELING HOPELESS, WORTHLESS AND HELPLESS..  Past Psychiatric History: Past Medical History  Diagnosis Date  . Barrett's esophagus   . Esophageal disease   . Hernia, hiatal   . Seizures   . Degenerative disc disease     reports that he has been smoking Cigarettes.  He has a 20 pack-year smoking history. He does not have any smokeless tobacco history on file. He reports that he uses  illicit drugs (Marijuana). He reports that he does not drink alcohol. History reviewed. No pertinent family history. Family History Substance Abuse: No Family Supports: No Living Arrangements: Other relatives Can pt return to current living arrangement?: Yes Abuse/Neglect [facility]) Physical Abuse: Denies Verbal Abuse: Denies Sexual Abuse: Denies Allergies:   Allergies  Allergen Reactions  . Hydrocodone Nausea And Vomiting    ACT Assessment Complete:  No:   Past Psychiatric History: Diagnosis:   Schizoaffective d/o, Major depressive d/o severe  Hospitalizations:  no  Outpatient Care:  yes  Substance Abuse Care:  none  Self-Mutilation:  Yes, cut arms  Suicidal Attempts:  Yes, cut self, rope to neck and tree  Homicidal Behaviors:  Denies past but endorsing HI today   Violent Behaviors:  Denies has a lot of anger issues.   Place of Residence:  [place] Marital Status:  SINGLE Employed/Unemployed:  UNEMPLOYED Education:  GED Family Supports:  NOT MUCH Objective: Blood pressure 119/75, pulse 84, temperature 97.5 F (36.4 C), temperature source Oral, resp. rate 17, SpO2 98.00%.There is no weight on file to calculate BMI. Results for orders placed during the hospital encounter of 11/23/12 (from the past 72 hour(s))  URINE RAPID DRUG SCREEN (HOSP PERFORMED)     Status: Abnormal   Collection Time    11/23/12 10:30 AM      Result Value Range   Opiates NONE DETECTED  NONE DETECTED   Cocaine NONE DETECTED  NONE DETECTED   Benzodiazepines NONE DETECTED  NONE DETECTED   Amphetamines NONE DETECTED  NONE DETECTED   Tetrahydrocannabinol POSITIVE (*) NONE DETECTED   Barbiturates NONE DETECTED  NONE DETECTED   Comment:            DRUG SCREEN FOR MEDICAL PURPOSES     ONLY.  IF CONFIRMATION IS NEEDED     FOR ANY PURPOSE, NOTIFY LAB     WITHIN 5 DAYS.                LOWEST DETECTABLE LIMITS     FOR URINE DRUG SCREEN     Drug Class       Cutoff (ng/mL)     Amphetamine      1000     Barbiturate      200     Benzodiazepine   200     Tricyclics       300     Opiates          300     Cocaine          300     THC              50  CBC WITH DIFFERENTIAL     Status: None   Collection Time    11/23/12 10:50 AM      Result Value Range   WBC 10.4  4.0 - 10.5 K/uL   RBC 5.49  4.22 - 5.81 MIL/uL   Hemoglobin 16.6  13.0 - 17.0 g/dL   HCT 16.1  09.6 - 04.5 %   MCV 86.7  78.0 - 100.0 fL   MCH 30.2  26.0 - 34.0 pg   MCHC 34.9  30.0 - 36.0 g/dL   RDW 40.9  81.1 - 91.4 %   Platelets 312  150  - 400 K/uL   Neutrophils Relative % 54  43 - 77 %   Neutro Abs 5.6  1.7 - 7.7 K/uL   Lymphocytes Relative 34  12 - 46 %   Lymphs  Abs 3.5  0.7 - 4.0 K/uL   Monocytes Relative 8  3 - 12 %   Monocytes Absolute 0.8  0.1 - 1.0 K/uL   Eosinophils Relative 4  0 - 5 %   Eosinophils Absolute 0.4  0.0 - 0.7 K/uL   Basophils Relative 1  0 - 1 %   Basophils Absolute 0.1  0.0 - 0.1 K/uL  COMPREHENSIVE METABOLIC PANEL     Status: Abnormal   Collection Time    11/23/12 10:50 AM      Result Value Range   Sodium 139  135 - 145 mEq/L   Potassium 3.9  3.5 - 5.1 mEq/L   Chloride 106  96 - 112 mEq/L   CO2 22  19 - 32 mEq/L   Glucose, Bld 112 (*) 70 - 99 mg/dL   BUN 13  6 - 23 mg/dL   Creatinine, Ser 1.61  0.50 - 1.35 mg/dL   Calcium 9.9  8.4 - 09.6 mg/dL   Total Protein 7.7  6.0 - 8.3 g/dL   Albumin 4.2  3.5 - 5.2 g/dL   AST 19  0 - 37 U/L   ALT 18  0 - 53 U/L   Alkaline Phosphatase 61  39 - 117 U/L   Total Bilirubin 0.7  0.3 - 1.2 mg/dL   GFR calc non Af Amer >90  >90 mL/min   GFR calc Af Amer >90  >90 mL/min   Comment: (NOTE)     The eGFR has been calculated using the CKD EPI equation.     This calculation has not been validated in all clinical situations.     eGFR's persistently <90 mL/min signify possible Chronic Kidney     Disease.  ETHANOL     Status: None   Collection Time    11/23/12 10:50 AM      Result Value Range   Alcohol, Ethyl (B) <11  0 - 11 mg/dL   Comment:            LOWEST DETECTABLE LIMIT FOR     SERUM ALCOHOL IS 11 mg/dL     FOR MEDICAL PURPOSES ONLY   Labs are reviewed and are pertinent for UDS is positive for Marijuana, rest of result is unremarkable.  Current Facility-Administered Medications  Medication Dose Route Frequency Provider Last Rate Last Dose  . acetaminophen (TYLENOL) tablet 650 mg  650 mg Oral Q4H PRN Marissa Sciacca, PA-C      . albuterol (PROVENTIL) (5 MG/ML) 0.5% nebulizer solution 5 mg  5 mg Nebulization Once Marissa Sciacca, PA-C      . alum &  mag hydroxide-simeth (MAALOX/MYLANTA) 200-200-20 MG/5ML suspension 30 mL  30 mL Oral PRN Marissa Sciacca, PA-C      . LORazepam (ATIVAN) tablet 1 mg  1 mg Oral Q8H PRN Marissa Sciacca, PA-C      . nicotine (NICODERM CQ - dosed in mg/24 hours) patch 21 mg  21 mg Transdermal Daily Marissa Sciacca, PA-C      . OLANZapine zydis (ZYPREXA) disintegrating tablet 5 mg  5 mg Oral BID Earney Navy, NP      . ondansetron (ZOFRAN) tablet 4 mg  4 mg Oral Q8H PRN Marissa Sciacca, PA-C      . pantoprazole (PROTONIX) EC tablet 40 mg  40 mg Oral Daily Marissa Sciacca, PA-C      . zolpidem (AMBIEN) tablet 5 mg  5 mg Oral QHS PRN Raymon Mutton, PA-C       Current Outpatient  Prescriptions  Medication Sig Dispense Refill  . ALPRAZolam (XANAX PO) Take 1 tablet by mouth daily.      Marland Kitchen esomeprazole (NEXIUM) 20 MG capsule Take 20 mg by mouth daily before breakfast.        Psychiatric Specialty Exam:     Blood pressure 119/75, pulse 84, temperature 97.5 F (36.4 C), temperature source Oral, resp. rate 17, SpO2 98.00%.There is no weight on file to calculate BMI.  General Appearance: Casual  Eye Contact::  Good  Speech:  Clear and Coherent and Normal Rate  Volume:  Normal  Mood:  Angry, Anxious, Depressed, Hopeless, Irritable and Worthless  Affect:  Congruent, Depressed, Flat, Labile and Tearful  Thought Process:  Coherent, Goal Directed and Intact  Orientation:  Full (Time, Place, and Person)  Thought Content:  Hallucinations: Auditory Command:  KILL PEOPLE, KILL HIS FIANCE  Suicidal Thoughts:  Yes.  with intent/plan  Homicidal Thoughts:  Yes.  with intent/plan  Memory:  Immediate;   Good Recent;   Good Remote;   Good  Judgement:  Poor  Insight:  Shallow  Psychomotor Activity:  Normal  Concentration:  Good  Recall:  NA  Akathisia:  NA  Handed:  Right  AIMS (if indicated):     Assets:  Desire for Improvement  Sleep:      Treatment Plan Summary:  Consult and face to face interview with Dr  Lolly Mustache We have accepted patient to our inpatient unit for admission We will start patient on Zyprexa ZYDIS 5 MG PO BID. We will start patient on a mood stabilizer lamictal. Daily contact with patient to assess and evaluate symptoms and progress in treatment Medication management  Earney Navy   PMHNP-BC 11/23/2012 11:54 AM  I have personally seen the patient and agreed with the findings and involved in the treatment plan. Kathryne Sharper, MD

## 2012-11-23 NOTE — ED Provider Notes (Signed)
CSN: [id number]     Arrival date & time 11/23/12  1022 History   First MD Initiated Contact with Patient 11/23/12 1046     Chief Complaint  Patient presents with  . Medical Clearance   (Consider location/radiation/quality/duration/timing/severity/associated sxs/prior Treatment) The history is provided by the patient. No language interpreter was used.  Keith Graves is a 27 year old male with past medical history of esophageal disease, DDD, Barrett's esophagus presenting to the emergency department with increased anger that has been ongoing since December 2013 when the patient's father unfortunately passed away-reported that the anger has increased over the past couple of weeks. Patient reports he gets easily agitated, constantly on edge. Reports that he has bad road rage, curses people out, gets into fights both verbally and physically very easily. Patient reports that he lives with his mother and fiance, reported that mother's health is not doing well. Patient reports that he had thoughts of suicide yesterday, attempted suicide with tying a rope around his neck then tying the other end of the rope to a tree and driving away so that his head snapped off. Patient reports that he coudln't go through it with. Patient reported that this is his fifth attempt at suicide. Patient reports that he was prescribed medications that he does not take. Reported that he does have thoughts of hurting others when he gets angry, reported that he has shortness of breath secondary to his bronchitis and smoking. Patient reports he smokes approximately 1-2 packs of cigarettes per day, at least a quarter of marijuana per day. Denied cocaine, heroin, alcohol use. Denied chest pain, difficulty breathing, headache, visual changes, weakness, abdominal pain, nausea, vomiting, changes to urinary and bowel movements. PCP Dr. Clelia Croft  Past Medical History  Diagnosis Date  . Barrett's esophagus   . Esophageal disease   . Hernia,  hiatal   . Seizures   . Degenerative disc disease    Past Surgical History  Procedure Laterality Date  . Knee surgery    . Esophagoscopy      biopsy   History reviewed. No pertinent family history. History  Substance Use Topics  . Smoking status: Current Every Day Smoker -- 2.00 packs/day for 10 years    Types: Cigarettes  . Smokeless tobacco: Not on file  . Alcohol Use: No    Review of Systems  HENT: Negative for trouble swallowing.   Eyes: Negative for visual disturbance.  Respiratory: Positive for shortness of breath. Negative for chest tightness.   Cardiovascular: Negative for chest pain.  Gastrointestinal: Negative for nausea, vomiting and abdominal pain.  Neurological: Negative for dizziness, weakness, numbness and headaches.  Psychiatric/Behavioral: Positive for suicidal ideas, sleep disturbance and decreased concentration. The patient is not nervous/anxious.   All other systems reviewed and are negative.    Allergies  Hydrocodone  Home Medications   No current outpatient prescriptions on file. BP 114/76  Pulse 82  Temp(Src) 97.6 F (36.4 C) (Oral)  Resp 16  SpO2 100% Physical Exam  Nursing note and vitals reviewed. Constitutional: He is oriented to person, place, and time. He appears well-developed and well-nourished. No distress.  HENT:  Head: Normocephalic and atraumatic.  Mouth/Throat: Oropharynx is clear and moist. No oropharyngeal exudate.  Eyes: Conjunctivae and EOM are normal. Pupils are equal, round, and reactive to light. Right eye exhibits no discharge. Left eye exhibits no discharge.  Neck: Normal range of motion. Neck supple.  Negative ecchymosis, lesions, sores, abrasions noted to the neck  Cardiovascular: Normal rate, regular rhythm and  normal heart sounds.  Exam reveals no friction rub.   No murmur heard. Pulses:      Radial pulses are 2+ on the right side, and 2+ on the left side.       Dorsalis pedis pulses are 2+ on the right side, and  2+ on the left side.  Pulmonary/Chest: Effort normal. He has wheezes.  Inspiratory wheezes identified upper and lower lobes bilaterally Negative respiratory distress Patient able to speak in full sentences without difficulty  Musculoskeletal: Normal range of motion.  Lymphadenopathy:    He has no cervical adenopathy.  Neurological: He is alert and oriented to person, place, and time. He exhibits normal muscle tone. Coordination normal.  Negative shakes or tremors noted  Skin: Skin is warm and dry. No rash noted. He is not diaphoretic. No erythema.  Psychiatric:  Flat affect Appears agitated     ED Course  Procedures (including critical care time)  11:29 Am Spoke with Reita Cliche from [facility] about patient.  Labs Review Labs Reviewed  COMPREHENSIVE METABOLIC PANEL - Abnormal; Notable for the following:    Glucose, Bld 112 (*)    All other components within normal limits  URINE RAPID DRUG SCREEN (HOSP PERFORMED) - Abnormal; Notable for the following:    Tetrahydrocannabinol POSITIVE (*)    All other components within normal limits  CBC WITH DIFFERENTIAL  ETHANOL   Imaging Review No results found.  EKG Interpretation   None       MDM   1. Schizoaffective disorder, depressive type     Patient presenting to emergency department with increased anger and suicidal ideation with attempt yesterday. This would be the patient's fifth attempt at suicide.  Full range of motion to upper and lower extremities bilaterally. Heart rate and rhythm normal. Inspiratory wheezes identified bilaterally to upper and lower lobes. Pulses palpable and strong. Negative signs of trauma, ecchymosis, lesions to the neck. CBC negative findings. CMP negative findings. Ethanol negative elevation. Urine drug screen positive for cannabis.  Patient given albuterol treatment while in ED for aid in wheezing and breathing relief. Home medications ordered. Psych ED orders. Patient medically cleared.   Discussed case with Reita Cliche from [facility]. Patient assessed and transferred to [facility].     Raymon Mutton, PA-C 11/23/12 2121

## 2012-11-23 NOTE — Progress Notes (Signed)
Patient ID: Keith Graves, male   DOB: [birth year], 27 y.o.   MRN: [id number] Patient admitted to [facility] unit for Voluntary admission. Patient presents disheveled, with depressed mood and blunted affect. Patient states ''I've been dealing with depression for about four years, but recently it's just been getting worse and I can't handle it. My dad died in Feb 06, 2023, and my mom has COPD and I live with her and I'm watching her health go, and my dog is dying and I'm fighting with my girlfriend. Everything I care about is going away. I tried to kill myself yesterday and this morning when I woke up I had thoughts of killing my girlfriend. I need help. '' Patient endorses auditory hallucinations of ''voices arguing in my head '' Pt states he ''hasn't taken any medications to help with for years ''  Patient search completed, skin assessed no wounds noted or contraband found. Patient LOW fall risk. Patient oriented to unit and ward rules. Pt verbalized understanding. Patient continues to endorse SI but contracts verbally for safety. In no acute distress at this time. Will continue to monitor q 15 minutes for safety.

## 2012-11-23 NOTE — BH Assessment (Signed)
Assessment Note  Keith Graves is an 27 y.o. male.   Pt experiencing psychosis related issues due to not taking medications for months due to expense.  Pt is a pt of Jorje Guild at [facility] optx med clinic.  Pt reports depression due to mother dying, father died a year ago and his dog almost died.  Pt has a lot of stressors and a long hx of mental health issues.  Pt has not taken his medications for almost a year.     Pt presents in ED with plan and intent to kill his fiance.  Attempts were made to contact fiance but were ineffective.    [facility] or [facility] needs to insure this is done prior to pt being d/c - there is a duty to warn responsibility in this case and due to pt threat to target his fiance contact needs to be made prior to pt being released into the community.  Pt reports putting a rope around his neck and attempting to hang self today.  Pt has prior psych hx and placement.   Pt hearing voices with commands to kill self and fiance.     Pt uses THC daily and alcohol weekly.  Pt reports THC helps de-escalate and improve his functioning.  Pt was last at [facility] in 2013 for inpatient.  Pt is restless, eye contact fair, agitated, Ox3, speech at times tangential but able to hold conversation.    Dr. Lolly Mustache and Julieanne Cotton NP evaluated pt and accepted him to 400 hall bed.  [facility] has no beds at this time.  Axis I: Major Depression, Recurrent severe and Schizoaffective Disorder Axis II: Deferred Axis III:  Past Medical History  Diagnosis Date  . Barrett's esophagus   . Esophageal disease   . Hernia, hiatal   . Seizures   . Degenerative disc disease    Axis IV: other psychosocial or environmental problems, problems related to social environment and problems with primary support group Axis V: 21-30 behavior considerably influenced by delusions or hallucinations OR serious impairment in judgment, communication OR inability to function in almost all areas  Past Medical History:  Past Medical History   Diagnosis Date  . Barrett's esophagus   . Esophageal disease   . Hernia, hiatal   . Seizures   . Degenerative disc disease     Past Surgical History  Procedure Laterality Date  . Knee surgery    . Esophagoscopy      biopsy    Family History: History reviewed. No pertinent family history.  Social History:  reports that he has been smoking Cigarettes.  He has a 20 pack-year smoking history. He does not have any smokeless tobacco history on file. He reports that he uses illicit drugs (Marijuana). He reports that he does not drink alcohol.  Additional Social History:  Alcohol / Drug Use Pain Medications: na Prescriptions: na Over the Counter: na History of alcohol / drug use?: Yes Substance #1 Name of Substance 1: thc 1 - Age of First Use: teen 1 - Amount (size/oz): varies 1 - Frequency: 3 to 5 times per week 1 - Duration: years 1 - Last Use / Amount: 11-22-12 Substance #2 Name of Substance 2: alcohol 2 - Age of First Use: teen 2 - Amount (size/oz): varies 2 - Frequency: 2x per week 2 - Duration: ongoing 2 - Last Use / Amount: 11-22-12  CIWA: CIWA-Ar BP: 119/75 mmHg Pulse Rate: 84 Nausea and Vomiting: no nausea and no vomiting Tactile Disturbances: none Tremor: no  tremor Auditory Disturbances: not present Paroxysmal Sweats: no sweat visible Visual Disturbances: not present Anxiety: two Headache, Fullness in Head: very mild Agitation: two Orientation and Clouding of Sensorium: oriented and can do serial additions CIWA-Ar Total: 5 COWS:    Allergies:  Allergies  Allergen Reactions  . Hydrocodone Nausea And Vomiting    Home Medications:  (Not in a hospital admission)  OB/GYN Status:  No LMP for male patient.  General Assessment Data Location of Assessment: [facility] ED Is this a Tele or Face-to-Face Assessment?: Face-to-Face Is this an Initial Assessment or a Re-assessment for this encounter?: Initial Assessment Living Arrangements: Other relatives Can pt  return to current living arrangement?: Yes Admission Status: Voluntary Is patient capable of signing voluntary admission?: Yes Transfer from: Acute Hospital Referral Source: MD  Medical Screening Exam [facility] Walk-in ONLY) Medical Exam completed: Yes  [facility] Crisis Care Plan Living Arrangements: Other relatives Name of Psychiatrist: Rae Halsted PA Name of Therapist: na  Education Status Is patient currently in school?: No Current Grade: na Highest grade of school patient has completed: na Name of school: na Contact person: na  Risk to self Suicidal Ideation: Yes-Currently Present Suicidal Intent: Yes-Currently Present Is patient at risk for suicide?: Yes Suicidal Plan?: Yes-Currently Present Specify Current Suicidal Plan: hang self Access to Means: Yes (had rope around neck today) Specify Access to Suicidal Means: had rope around neck today What has been your use of drugs/alcohol within the last 12 months?: alcohol and THC Previous Attempts/Gestures: Yes How many times?: 2 Other Self Harm Risks: na Triggers for Past Attempts: Unpredictable Intentional Self Injurious Behavior: None Family Suicide History: No Recent stressful life event(s): Conflict (Comment);Other (Comment) (loss; grief; relationship issues) Persecutory voices/beliefs?: Yes Depression: Yes Depression Symptoms: Isolating;Fatigue;Guilt;Loss of interest in usual pleasures;Feeling worthless/self pity;Feeling angry/irritable Substance abuse history and/or treatment for substance abuse?: Yes Suicide prevention information given to non-admitted patients: Not applicable  Risk to Others Homicidal Ideation: Yes-Currently Present Thoughts of Harm to Others: Yes-Currently Present Comment - Thoughts of Harm to Others: reported 'I want slit my fiance throat or smoother her" Current Homicidal Intent: Yes-Currently Present Current Homicidal Plan: Yes-Currently Present Describe Current Homicidal Plan: wants to kill fiance per  pt upon arrival Access to Homicidal Means: Yes Describe Access to Homicidal Means: can get a knife or use a pillow Identified Victim: fiance  History of harm to others?: No Assessment of Violence: None Noted Violent Behavior Description: pt is irritable Does patient have access to weapons?: Yes (Comment) (can get a knife) Criminal Charges Pending?: No Does patient have a court date: No  Psychosis Hallucinations: Auditory;Visual;With command Delusions: Unspecified;Persecutory  Mental Status Report Appear/Hygiene: Disheveled Eye Contact: Fair Motor Activity: Agitation;Restlessness Speech: Logical/coherent;Tangential Level of Consciousness: Alert;Restless;Irritable Mood: Depressed;Anxious;Labile;Suspicious;Angry;Irritable;Worthless, low self-esteem Affect: Angry;Anxious;Appropriate to circumstance;Depressed;Irritable;Labile;Threatening Anxiety Level: Severe Thought Processes: Tangential Judgement: Impaired Orientation: Person;Place;Situation Obsessive Compulsive Thoughts/Behaviors: Moderate  Cognitive Functioning Concentration: Decreased Memory: Recent Intact;Remote Intact IQ: Average Insight: Poor Impulse Control: Poor Appetite: Fair Weight Loss: 0 Weight Gain: 0 Sleep: Decreased Total Hours of Sleep: 4 Vegetative Symptoms: None  ADLScreening [facility] Assessment Services) Patient's cognitive ability adequate to safely complete daily activities?: Yes Patient able to express need for assistance with ADLs?: Yes Independently performs ADLs?: Yes (appropriate for developmental age)  Prior Inpatient Therapy Prior Inpatient Therapy: Yes Prior Therapy Dates: 2013 Prior Therapy Facilty/Provider(s): [facility] Reason for Treatment: SI  Prior Outpatient Therapy Prior Outpatient Therapy: Yes Prior Therapy Dates: 2014 Prior Therapy Facilty/Provider(s): [facility]or[facility] Reason for Treatment: med  mgt  ADL Screening (condition at time of admission) Patient's cognitive ability adequate  to safely complete daily activities?: Yes Is the patient deaf or have difficulty hearing?: No Does the patient have difficulty seeing, even when wearing glasses/contacts?: No Does the patient have difficulty concentrating, remembering, or making decisions?: No Patient able to express need for assistance with ADLs?: Yes Does the patient have difficulty dressing or bathing?: No Independently performs ADLs?: Yes (appropriate for developmental age) Does the patient have difficulty walking or climbing stairs?: No Weakness of Legs: None Weakness of Arms/Hands: None  Home Assistive Devices/Equipment Home Assistive Devices/Equipment: None  Therapy Consults (therapy consults require a physician order) PT Evaluation Needed: No OT Evalulation Needed: No SLP Evaluation Needed: No Abuse/Neglect Assessment (Assessment to be complete while patient is alone) Physical Abuse: Denies Verbal Abuse: Denies Sexual Abuse: Denies Exploitation of patient/patient's resources: Denies Self-Neglect: Denies Values / Beliefs Cultural Requests During Hospitalization: None Spiritual Requests During Hospitalization: None Consults Spiritual Care Consult Needed: No Social Work Consult Needed: No Merchant navy officer (For Healthcare) Advance Directive: Patient does not have advance directive Pre-existing out of facility DNR order (yellow form or pink MOST form): No    Additional Information 1:1 In Past 12 Months?: No CIRT Risk: No Elopement Risk: No Does patient have medical clearance?: No     Disposition:  Disposition Initial Assessment Completed for this Encounter: Yes Disposition of Patient: Inpatient treatment program Type of inpatient treatment program: Adult  On Site Evaluation by:   Reviewed with Physician:    Titus Mould, Eppie Gibson 11/23/2012 11:55 AM

## 2012-11-23 NOTE — ED Notes (Signed)
Pt ambulatory to [facility] w/ [facility] transport.  Pt has no belongings.

## 2012-11-23 NOTE — ED Notes (Signed)
Sleeping soundly, easily aroused, ACT into see.

## 2012-11-23 NOTE — Tx Team (Signed)
Initial Interdisciplinary Treatment Plan  PATIENT STRENGTHS: (choose at least two) Ability for insight Average or above average intelligence Communication skills General fund of knowledge Physical Health  PATIENT STRESSORS: Educational concerns Health problems Loss of dad Medication change or noncompliance Substance abuse   PROBLEM LIST: Problem List/Patient Goals Date to be addressed Date deferred Reason deferred Estimated date of resolution  Suicidal ideation/homicidal 11-23-12   dc  Altered though process 11-23-12   dc  susbtance abuse (thc) 11-23-12   dc                                       DISCHARGE CRITERIA:  Ability to meet basic life and health needs Improved stabilization in mood, thinking, and/or behavior Motivation to continue treatment in a less acute level of care Reduction of life-threatening or endangering symptoms to within safe limits Verbal commitment to aftercare and medication compliance  PRELIMINARY DISCHARGE PLAN: Attend aftercare/continuing care group Attend 12-step recovery group Participate in family therapy Return to previous living arrangement  PATIENT/FAMIILY INVOLVEMENT: This treatment plan has been presented to and reviewed with the patient, Keith Graves, and/or family member, .  The patient and family have been given the opportunity to ask questions and make suggestions.  [name] 11/23/2012, 4:42 PM

## 2012-11-23 NOTE — Progress Notes (Signed)
Psychoeducational Group Note  Date:  11/23/2012 Time:  2000  Group Topic/Focus:  Wrap-Up Group:   The focus of this group is to help patients review their daily goal of treatment and discuss progress on daily workbooks.  Participation Level: Did Not Attend  Participation Quality:  Not Applicable  Affect:  Not Applicable  Cognitive:  Not Applicable  Insight:  Not Applicable  Engagement in Group: Not Applicable  Additional Comments:  The patient did not attend group since he was asleep in his bed.   Hazle Coca S 11/23/2012, 9:16 PM

## 2012-11-23 NOTE — Progress Notes (Signed)
Josephine NP accepted pt to [facility] and is going to [id number] and will be transported to [facility] by [facility].  He will go to the services of M. Akintayo.  Call report [phone]

## 2012-11-23 NOTE — ED Notes (Signed)
[facility] here to transport 

## 2012-11-23 NOTE — ED Notes (Signed)
Pt states that he has been having anger issues for the past few weeks.  States that his dad passed away a year ago, his mother is dying, and his dog almost died.  States that he feels like hurting himself and others (specifically his fiance and his neighbors).  Pt tied a rope around his neck the other day, tied the other end to a tree, got in the car and was going to drive until his neck snapped.  Thought about driving in front of an 18 wheeler.  States that he wants to slit his fiance's throat or smother her in her sleep.

## 2012-11-23 NOTE — ED Notes (Signed)
Pt's fiance took home all belongings.

## 2012-11-24 ENCOUNTER — Encounter (HOSPITAL_COMMUNITY): Payer: Self-pay | Admitting: Psychiatry

## 2012-11-24 DIAGNOSIS — T1491XA Suicide attempt, initial encounter: Secondary | ICD-10-CM | POA: Diagnosis present

## 2012-11-24 DIAGNOSIS — F332 Major depressive disorder, recurrent severe without psychotic features: Secondary | ICD-10-CM

## 2012-11-24 DIAGNOSIS — F1210 Cannabis abuse, uncomplicated: Secondary | ICD-10-CM | POA: Diagnosis present

## 2012-11-24 DIAGNOSIS — F259 Schizoaffective disorder, unspecified: Secondary | ICD-10-CM | POA: Diagnosis present

## 2012-11-24 LAB — TSH: TSH: 0.629 u[IU]/mL (ref 0.350–4.500)

## 2012-11-24 MED ORDER — OLANZAPINE 5 MG PO TBDP
5.0000 mg | ORAL_TABLET | Freq: Two times a day (BID) | ORAL | Status: DC
Start: 2012-11-24 — End: 2012-11-25
  Administered 2012-11-24 – 2012-11-25 (×3): 5 mg via ORAL
  Filled 2012-11-24 (×6): qty 1

## 2012-11-24 MED ORDER — ALBUTEROL SULFATE (5 MG/ML) 0.5% IN NEBU
2.5000 mg | INHALATION_SOLUTION | Freq: Once | RESPIRATORY_TRACT | Status: AC
  Administered 2012-11-24: 2.5 mg via RESPIRATORY_TRACT
  Filled 2012-11-24: qty 0.5

## 2012-11-24 NOTE — Progress Notes (Signed)
[facility] Group Notes:  (Nursing/MHT/Case Management/Adjunct)  Date:  11/24/2012  Time:  2000  Type of Therapy:  Psychoeducational Skills  Participation Level:  Active  Participation Quality:  Appropriate  Affect:  Appropriate  Cognitive:  Appropriate  Insight:  Good  Engagement in Group:  Developing/Improving  Modes of Intervention:  Education  Summary of Progress/Problems: The patient verbalized that he had a good day until late in the afternoon when he became upset. He would not share very much about the incident except to say that he went to his room as a means of coping. His goal for tomorrow is to have a new outlook on life. As a theme of the day, he did not have a support system.   Hazle Coca S 11/24/2012, 11:35 PM

## 2012-11-24 NOTE — H&P (Signed)
Psychiatric Admission Assessment Adult  Patient Identification:  Keith Graves Date of Evaluation:  11/24/2012 Chief Complaint:  MDD, schizo affective disorder History of Present Illness:  On admission:  27 year old male with past medical history of esophageal disease, DDD, Barrett's esophagus presenting to the emergency department with increased anger that has been ongoing since December 2013 when the patient's father unfortunately passed away-reported that the anger has increased over the past couple of weeks. Patient reports he gets easily agitated, constantly on edge. Reports that he has bad road rage, curses people out, gets into fights both verbally and physically very easily. Patient reports that he lives with his mother and fiance, reported that mother's health is not doing well. Patient reports that he had thoughts of suicide yesterday, attempted suicide with tying a rope around his neck then tying the other end of the rope to a tree and driving away so that his head snapped off. Patient reports that he coudln't go through it with. Patient reported that this is his fifth attempt at suicide. Patient reports that he was prescribed medications that he does not take. Reported that he does have thoughts of hurting others when he gets angry, reported that he has shortness of breath secondary to his bronchitis and smoking. Patient reports he smokes approximately 1-2 packs of cigarettes per day, at least a quarter of marijuana per day. Denied cocaine, heroin, alcohol use. Denied chest pain, difficulty breathing, headache, visual changes, weakness, abdominal pain, nausea, vomiting, changes to urinary and bowel movements.  Elements:  Location:  generalized. Quality:  acute. Severity:  severe. Timing:  constant. Duration:  past few weeks. Context:  stressors. Associated Signs/Synptoms: Depression Symptoms:  difficulty concentrating, hopelessness, impaired memory, suicidal attempt, (Hypo) Manic  Symptoms:  Hallucinations, Psychotic Symptoms:  Hallucinations: Auditory Visual   Psychiatric Specialty Exam: Physical Exam  Constitutional: He is oriented to person, place, and time. He appears well-developed and well-nourished.  HENT:  Head: Normocephalic and atraumatic.  Neck: Normal range of motion.  Respiratory: Effort normal.  Musculoskeletal: Normal range of motion.  Neurological: He is alert and oriented to person, place, and time.  Skin: Skin is warm and dry.   Complete Exam in ED, concur with findings  Review of Systems  Constitutional: Negative.   HENT: Negative.   Eyes: Negative.   Respiratory: Negative.   Cardiovascular: Negative.   Gastrointestinal: Negative.   Genitourinary: Negative.   Musculoskeletal: Negative.   Skin: Negative.   Neurological: Negative.   Endo/Heme/Allergies: Negative.   Psychiatric/Behavioral: Positive for depression, suicidal ideas, hallucinations and substance abuse.    Blood pressure 121/85, pulse 83, temperature 97.3 F (36.3 C), temperature source Oral, resp. rate 16, height 5\' 8"  (1.727 m), weight 68.04 kg (150 lb), SpO2 99.00%.Body mass index is 22.81 kg/(m^2).  General Appearance: Casual  Eye Contact::  Poor  Speech:  Slow  Volume:  Decreased  Mood:  Depressed  Affect:  Congruent  Thought Process:  Coherent  Orientation:  Full (Time, Place, and Person)  Thought Content:  Hallucinations: Auditory Visual  Suicidal Thoughts:  Yes.  with intent/plan  Homicidal Thoughts:  No  Memory:  Immediate;   Fair Recent;   Fair Remote;   Fair  Judgement:  Poor  Insight:  Lacking  Psychomotor Activity:  Decreased  Concentration:  Poor  Recall:  Fair  Akathisia:  No  Handed:  Right  AIMS (if indicated):     Assets:  Physical Health Resilience Social Support  Sleep:  Number of Hours: 5.75  Past Psychiatric History: Diagnosis:  Schizoaffective disorder  Hospitalizations:  None  Outpatient Care:  [facility]  Substance Abuse Care:   None  Self-Mutilation:  None  Suicidal Attempts:  Multiple but never sought treatment  Violent Behaviors: None   Past Medical History:   Past Medical History  Diagnosis Date  . Barrett's esophagus   . Esophageal disease   . Hernia, hiatal   . Seizures   . Degenerative disc disease    None. Allergies:   Allergies  Allergen Reactions  . Hydrocodone Nausea And Vomiting   PTA Medications: Prescriptions prior to admission  Medication Sig Dispense Refill  . esomeprazole (NEXIUM) 20 MG capsule Take 20 mg by mouth daily before breakfast.        Previous Psychotropic Medications:  Medication/Dose   See PTA above     Substance Abuse History in the last 12 months:  yes  Consequences of Substance Abuse: NA  Social History:  reports that he has been smoking Cigarettes.  He has a 20 pack-year smoking history. He does not have any smokeless tobacco history on file. He reports that he uses illicit drugs (Marijuana). He reports that he does not drink alcohol. Additional Social History: Pain Medications: no Prescriptions: no Over the Counter: no History of alcohol / drug use?: Yes Name of Substance 1: marijuana 1 - Age of First Use: unsure 1 - Amount (size/oz): 1, to 1.5 gm 1 - Frequency: daily 1 - Duration: unsure 1 - Last Use / Amount: yesterday   Current Place of Residence:   Place of Birth:   Family Members: Marital Status:  Single Children:  Sons:  Daughters: Relationships: Education:  GED Educational Problems/Performance: Religious Beliefs/Practices: History of Abuse (Emotional/Phsycial/Sexual) Occupational Experiences; Military History:  None. Legal History: Hobbies/Interests:  Family History:  History reviewed. No pertinent family history.  Results for orders placed during the hospital encounter of 11/23/12 (from the past 72 hour(s))  TSH     Status: None   Collection Time    11/23/12  7:10 PM      Result Value Range   TSH 0.629  0.350 - 4.500 uIU/mL    Comment: Performed at [facility]   Psychological Evaluations:  Assessment:   DSM5:  Substance/Addictive Disorders:  Cannabis Use Disorder - Severe (304.30) Depressive Disorders:  Major Depressive Disorder - Severe (296.23)  AXIS I:  Major Depression, Recurrent severe and Schizoaffective Disorder AXIS II:  Deferred AXIS III:   Past Medical History  Diagnosis Date  . Barrett's esophagus   . Esophageal disease   . Hernia, hiatal   . Seizures   . Degenerative disc disease    AXIS IV:  other psychosocial or environmental problems, problems related to social environment and problems with primary support group AXIS V:  41-50 serious symptoms  Treatment Plan/Recommendations:  Plan:  Review of chart, vital signs, medications, and notes. 1-Admit for crisis management and stabilization.  Estimated length of stay 5-7 days past his current stay of 1 2-Individual and group therapy encouraged 3-Medication management for psychosis to reduce current symptoms to base line and improve the patient's overall level of functioning:  Medications reviewed with the patient and she stated no untoward effects, Zyprexa started 4-Coping skills for chronic mental illness 5-Continue crisis stabilization and management 6-Address health issues--monitoring vital signs, stable  7-Treatment plan in progress to prevent relapse of psychosis 8-Psychosocial education regarding relapse prevention and self-care 8-Health care follow up as needed for any health concerns  9-Call for consult with hospitalist  for additional specialty patient services as needed.  Treatment Plan Summary: Daily contact with patient to assess and evaluate symptoms and progress in treatment Medication management Supportive approach/coping skills/identify triggers for this decompensation Optimize treatment with psychotorpics Current Medications:  Current Facility-Administered Medications  Medication Dose Route Frequency Provider  Last Rate Last Dose  . acetaminophen (TYLENOL) tablet 650 mg  650 mg Oral Q6H PRN Earney Navy, NP   650 mg at 11/24/12 0812  . alum & mag hydroxide-simeth (MAALOX/MYLANTA) 200-200-20 MG/5ML suspension 30 mL  30 mL Oral Q4H PRN Earney Navy, NP      . LORazepam (ATIVAN) tablet 1 mg  1 mg Oral Q8H PRN Earney Navy, NP   1 mg at 11/24/12 0812  . magnesium hydroxide (MILK OF MAGNESIA) suspension 30 mL  30 mL Oral Daily PRN Earney Navy, NP      . nicotine (NICODERM CQ - dosed in mg/24 hours) patch 21 mg  21 mg Transdermal Daily Earney Navy, NP   21 mg at 11/24/12 0811  . OLANZapine zydis (ZYPREXA) disintegrating tablet 5 mg  5 mg Oral BID Nanine Means, NP      . ondansetron (ZOFRAN) tablet 4 mg  4 mg Oral Q8H PRN Earney Navy, NP      . pantoprazole (PROTONIX) EC tablet 40 mg  40 mg Oral Daily Earney Navy, NP   40 mg at 11/24/12 0813  . zolpidem (AMBIEN) tablet 5 mg  5 mg Oral QHS PRN Earney Navy, NP        Observation Level/Precautions:  15 minute checks  Laboratory:  Completed, reviewed, stable  Psychotherapy:  Individual and group therapy  Medications:  Zyprexa  Consultations:  None  Discharge Concerns:  Medication obtainment    Estimated LOS:5-7 days  Other:     I certify that inpatient services furnished can reasonably be expected to improve the patient's condition.   Nanine Means, PMh-NP 10/19/201411:26 AM I personally evalusted this patient, reviewed the physical exam, and agree with the treatment plan Madie Reno A. Dub Mikes, M.D.

## 2012-11-24 NOTE — Progress Notes (Signed)
Patient ID: Keith Graves, male   DOB: 1985-09-24, 27 y.o.   MRN: [id number] D. The patient spent all evening resting in bed with his eyes closed.  A. Attempt made to awaken patient to attend evening wrap up group. Awakened and administered HS medication. Offered snack. Reviewed schedule and unit and procedures. R. The patient did not attend evening group. Compliant with HS medication.

## 2012-11-24 NOTE — Progress Notes (Signed)
Patient ID: Keith Graves, male   DOB: [birth year], 27 y.o.   MRN: [id number] D. Patient presents with depressed mood , affect congruent. Patient continues to endorse auditory hallucinations stating '' yeah the voices are still there, they are pretty much the same, arguing in my head but the medication did help me a little bit (the zyprexa) I could tell after I took it they were a little muted. Patient completed self inventory and rated depression at 8/10 on depression scale, 10 being worst depression 1 being least. Patient rated hopelessness at same on same scale. Patient also endorses continued suicidal ideation, off and on, stating '' Well I just got to thinking on I could just bang my head really hard on the metal somewhere,  But I really don't want to do that'' Patient states '' I'd let you know if I couldn't control myself '' A. Support and encouragement provided. Medications given as ordered, including prn medications throughout the shift. Patient reported improvements.Discussed above information with MD and patients continued psychotic symptoms. R. Patient has remained visible in the milieu, attending unit programming. No further voiced concerns at this time. Will continue to monitor q 15 minutes for safety.

## 2012-11-24 NOTE — [facility] Group Notes (Signed)
[facility] Group Notes:  (Clinical Social Work)  11/24/2012   11:15am-12:00pm  Summary of Progress/Problems:  The main focus of today's process group was to listen to a variety of genres of music and to identify that different types of music provoke different responses.  The patient then was able to identify personally what was soothing for them, as well as energizing.  Handouts were used to record feelings evoked, as well as how patient can personally use this knowledge in sleep habits, with depression, and with other symptoms.  The patient expressed understanding of concepts, as well as knowledge of how each type of music affected them and how this can be used when they are at home as a tool in their recovery.  Type of Therapy:  Music Therapy   Participation Level:  Active  Participation Quality:  Attentive and Sharing  Affect:  Blunted  Cognitive:  Oriented  Insight:  Engaged  Engagement in Therapy:  Engaged  Modes of Intervention:   Activity, Exploration  Ambrose Mantle, LCSW 11/24/2012, 12:51 PM

## 2012-11-24 NOTE — [facility] Suicide Risk Assessment (Signed)
Suicide Risk Assessment  Admission Assessment     Nursing information obtained from:  Patient Demographic factors:  Male;Adolescent or young adult;Low socioeconomic status Current Mental Status:  Suicidal ideation indicated by patient;Self-harm thoughts Loss Factors:  Loss of significant relationship;Financial problems / change in socioeconomic status Historical Factors:  Prior suicide attempts;Anniversary of important loss;Impulsivity Risk Reduction Factors:  Living with another person, especially a relative  CLINICAL FACTORS:   Bipolar Disorder:   Depressive phase Depression:   Hopelessness Impulsivity Insomnia  COGNITIVE FEATURES THAT CONTRIBUTE TO RISK:  Closed-mindedness Polarized thinking Thought constriction (tunnel vision)    SUICIDE RISK:   Moderate:  Frequent suicidal ideation with limited intensity, and duration, some specificity in terms of plans, no associated intent, good self-control, limited dysphoria/symptomatology, some risk factors present, and identifiable protective factors, including available and accessible social support.  PLAN OF CARE: Supportive approach/coping skills/relapse prevention                               CBT/mindfulness/anger management                               Will reassess his symptoms, diagnosis and optimize treatment with psychotropics  I certify that inpatient services furnished can reasonably be expected to improve the patient's condition.  Keith Graves A 11/24/2012, 3:43 PM

## 2012-11-24 NOTE — [facility] Counselor (Signed)
Adult Comprehensive Assessment  Patient ID: Keith Graves, male   DOB: [birth year], 27 y.o.   MRN: [id number]  Information Source: Information source: Patient  Current Stressors:  Educational / Learning stressors: NA Pt has GED Employment / Job issues: Unemployed for 6 months; fears of job reentry Family Relationships: Strain between girlfriend and mother with whom he lives which in turn effects him Surveyor, quantity / Lack of resources (include bankruptcy): "Not good at all currently" Housing / Lack of housing: NA Physical health (include injuries & life threatening diseases): Off psych meds 1-2 years due to lack of funds Social relationships: Only one good friend and girlfriend jealous of time they spend together Substance abuse: History of alcohol abuse, clean from alcohol 10 years. Patient currently uses 2-3 grams THC daily Bereavement / Loss: Father 2013  Living/Environment/Situation:  Living Arrangements: Parent;Spouse/significant other Living conditions (as described by patient or guardian): Patient has lived with girlfriend for 1.5 years and they have both been with pt's mother for past year How long has patient lived in current situation?: 1 year What is atmosphere in current home: Chaotic;Comfortable;Supportive  Family History:  Marital status: Long term relationship Long term relationship, how long?: 1.5 year with current girlfriend What types of issues is patient dealing with in the relationship?: Stress due to patient's non compliance with medications, unemployment, financial strains and girlfriend's demands of all his time Additional relationship information: Girlfriend working as Teacher, English as a foreign language, new to [place] not many friends of her own, plan is for marriage Does patient have children?: No  Childhood History:  By whom was/is the patient raised?: Both parents Additional childhood history information: Father had multiple mental health and substance abuse issues; patient learned at 33 or 81  that father was only his step dad and not his biological father. Never met biological father who has two other sons Description of patient's relationship with caregiver when they were a child: "Not good" Patient's description of current relationship with people who raised him/her: Better in las few years since pt quit using alcohol Does patient have siblings?: Yes Number of Siblings: 1 Description of patient's current relationship with siblings: Good with sister (step sibling) Did patient suffer any verbal/emotional/physical/sexual abuse as a child?: Yes (Patient reports he felt it was abusive that he was unaware father (stepfather) was not his biological father until age 19 or 42) Did patient suffer from severe childhood neglect?: No Has patient ever been sexually abused/assaulted/raped as an adolescent or adult?: No Was the patient ever a victim of a crime or a disaster?: No Witnessed domestic violence?: No Has patient been effected by domestic violence as an adult?: No  Education:  Highest grade of school patient has completed: 75 th and GED Currently a student?: No Name of school: NA Learning disability?: No  Employment/Work Situation:   Employment situation: Unemployed Patient's job has been impacted by current illness: Yes Describe how patient's job has been impacted: Paranoia, fears and anxiety lead him to have difficulties with coworkers and sometimes avoid work  What is the longest time patient has a held a job?: 6 years 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 Has patient ever been in the [employer]?: No Has patient ever served in Buyer, retail?: No  Financial Resources:   Surveyor, quantity resources: Food stamps Does patient have a Lawyer or guardian?: No  Alcohol/Substance Abuse:   What has been your use of drugs/alcohol within the last 12 months?: THC daily 2-3 grams; patient reports no alcohol use for last 10 years  Alcohol/Substance Abuse Treatment Hx:  Denies past history Has alcohol/substance abuse ever caused legal problems?: Yes (Three arrests for possession, paraphernalia and intent to sell)  Social Support System:   Patient's Community Support System: Good Describe Community Support System: Mother, sister, girlfriend and best friend Type of faith/religion: Belief in God How does patient's faith help to cope with current illness?: Pt reports he prayed twice and it was helpful  Leisure/Recreation:   Leisure and Hobbies: Dog Rescue efforts currently (causes some problems as done at [employer] home)  Strengths/Needs:   What things does the patient do well?: Intelligent, good employee, good communication skills, persistent In what areas does patient struggle / problems for patient: Relationships, startles easily, anger, struggles with taking on work commitment then [employer] to point of exhaustion and irritability  Discharge Plan:   Does patient have access to transportation?: Yes Will patient be returning to same living situation after discharge?: Yes Currently receiving community mental health services: No If no, would patient like referral for services when discharged?: Yes (What county[place] [facility] sales [place]) Does patient have financial barriers related to discharge medications?: Yes Patient description of barriers related to discharge medications: Needs to be low cost or will go off medications  Summary/Recommendations:   Summary and Recommendations (to be completed by the evaluator): patient is 27 YO single unemployed caucasian male admitted with diagnosis of Major Depression, Recurrent, Severe and Schizoaffective Disorder. Patient would benefit from crisis stabilization, medication evaluation, therapy groups for processing thoughts/feelings/experiences, psycho ed groups for increasing coping skills, and aftercare planning  Clide Dales. 11/24/2012

## 2012-11-24 NOTE — Progress Notes (Signed)
Patient ID: Keith Graves, male   DOB: 1985/02/25, 27 y.o.   MRN: [id number] Information for [facility]ational [facility] placed in patient's chart as patient reported during PSA his difficulties maintaining employment. Patient will need to be referred to [facility] as he no longer has insurance to cover being seen at [facility].   Patient's girlfriend will need to be contacted as initial [facility] assessment notes 'Duty to Warn.' Carney Bern, LCSW

## 2012-11-24 NOTE — [facility] Group Notes (Signed)
[facility] Group Notes:  (Nursing/MHT/Case Management/Adjunct)  Date:  11/24/2012  Time:  10:08 AM  Type of Therapy:  Psychoeducational Skills  Participation Level:  Active  Participation Quality:  Appropriate  Affect:  Anxious and Depressed  Cognitive:  appropriate  Insight:  Improving  Engagement in Group:  Engaged  Modes of Intervention:  Discussion, Education and Exploration  Summary of Progress/Problems: Patient reports '' I've been using weed to cope with my depression and agitation and it's not working .Marland Kitchen '' Discussed healthy support systems, self inventory review with RN.  [name] 11/24/2012, 10:08 AM

## 2012-11-25 DIAGNOSIS — F1210 Cannabis abuse, uncomplicated: Secondary | ICD-10-CM

## 2012-11-25 MED ORDER — BENZTROPINE MESYLATE 0.5 MG PO TABS
0.5000 mg | ORAL_TABLET | Freq: Two times a day (BID) | ORAL | Status: DC
  Administered 2012-11-25 – 2012-11-27 (×4): 0.5 mg via ORAL
  Filled 2012-11-25 (×6): qty 1

## 2012-11-25 MED ORDER — IBUPROFEN 200 MG PO TABS
400.0000 mg | ORAL_TABLET | Freq: Four times a day (QID) | ORAL | Status: DC | PRN
  Administered 2012-11-25 – 2012-11-28 (×2): 400 mg via ORAL
  Filled 2012-11-25 (×2): qty 2

## 2012-11-25 MED ORDER — ENSURE COMPLETE PO LIQD
237.0000 mL | Freq: Two times a day (BID) | ORAL | Status: DC
  Administered 2012-11-25 – 2012-11-27 (×4): 237 mL via ORAL

## 2012-11-25 MED ORDER — HALOPERIDOL 5 MG PO TABS
5.0000 mg | ORAL_TABLET | Freq: Two times a day (BID) | ORAL | Status: DC
  Administered 2012-11-25 – 2012-11-28 (×6): 5 mg via ORAL
  Filled 2012-11-25 (×8): qty 1

## 2012-11-25 MED ORDER — TRAZODONE HCL 50 MG PO TABS
50.0000 mg | ORAL_TABLET | Freq: Every evening | ORAL | Status: DC | PRN
  Administered 2012-11-27 – 2012-11-28 (×2): 50 mg via ORAL
  Filled 2012-11-25: qty 14
  Filled 2012-11-25 (×2): qty 1

## 2012-11-25 MED ORDER — CARBAMAZEPINE 200 MG PO TABS
200.0000 mg | ORAL_TABLET | Freq: Two times a day (BID) | ORAL | Status: DC
  Administered 2012-11-25 – 2012-11-29 (×8): 200 mg via ORAL
  Filled 2012-11-25 (×5): qty 1
  Filled 2012-11-25: qty 28
  Filled 2012-11-25 (×3): qty 1
  Filled 2012-11-25: qty 28
  Filled 2012-11-25 (×2): qty 1

## 2012-11-25 NOTE — Progress Notes (Signed)
[facility] MD Progress Note  11/25/2012 11:20 AM Keith Graves  MRN:  [id number] Subjective: " I woke up with anger this morning." Objective: Patient reports ongoing auditory and visual hallucination; hearing voices and seeing black spots. He also reports that he has been getting irritable, anxious and difficulty controlling his anger. He says that Risperdal and Lamictal worked for him in the past but stopped taking his medications when he lost his job and insurance. He has been self medicating by smoking marijuana. 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   HENT: Negative.   Eyes: Negative.   Respiratory: Negative.   Cardiovascular: Negative.   Gastrointestinal: Negative.   Genitourinary: Negative.   Musculoskeletal: Negative.   Skin: Negative.   Neurological: Negative.   Endo/Heme/Allergies: Negative.   Psychiatric/Behavioral: Positive for suicidal ideas, hallucinations and substance abuse. The patient is nervous/anxious and has insomnia.     Blood pressure 125/81, pulse 83, temperature 96.5 F (35.8 C), temperature source Oral, resp. rate 16, height 5\' 8"  (1.727 m), weight 68.04 kg (150 lb), SpO2 99.00%.Body mass index is 22.81 kg/(m^2).  General Appearance: Fairly Groomed  Patent attorney::  Fair  Speech:  Pressured  Volume:  Normal  Mood:  Irritable  Affect:  Labile and Full Range  Thought Process:  Goal Directed  Orientation:  Full (Time, Place, and Person)  Thought Content:   Hallucinations: Auditory Visual  Suicidal Thoughts:  No  Homicidal Thoughts:  No  Memory:  Immediate;   Fair Recent;   Fair Remote;   Fair  Judgement:  Poor  Insight:  Lacking  Psychomotor Activity:  Increased  Concentration:  Fair  Recall:  Fair  Akathisia:  No  Handed:  Right  AIMS (if indicated):     Assets:  Desire for Improvement Physical Health  Sleep:  Number of Hours: 5   Current Medications: Current Facility-Administered Medications  Medication Dose Route Frequency Provider Last Rate Last Dose  . alum & mag hydroxide-simeth (MAALOX/MYLANTA) 200-200-20 MG/5ML suspension 30 mL  30 mL Oral Q4H PRN Earney Navy, NP      . ibuprofen (ADVIL,MOTRIN) tablet 400 mg  400 mg Oral Q6H PRN Rylee Huestis      . LORazepam (ATIVAN) tablet 1 mg  1 mg Oral Q8H PRN Earney Navy, NP   1 mg at 11/25/12 1017  . magnesium hydroxide (MILK OF MAGNESIA) suspension 30 mL  30 mL Oral Daily PRN Earney Navy, NP      . nicotine (NICODERM CQ - dosed in mg/24 hours) patch 21 mg  21 mg Transdermal Daily Earney Navy, NP   21 mg at 11/25/12 0809  . OLANZapine zydis (ZYPREXA) disintegrating tablet 5 mg  5 mg Oral BID Nanine Means, NP   5 mg at 11/25/12 0810  . ondansetron (ZOFRAN) tablet 4 mg  4 mg Oral Q8H PRN Earney Navy, NP      . pantoprazole (PROTONIX) EC tablet 40 mg  40 mg Oral Daily Earney Navy, NP   40 mg at 11/25/12 0810  . traZODone (DESYREL) tablet 50 mg  50 mg Oral QHS PRN Nasim Habeeb        Lab Results:  Results for orders placed during the hospital encounter of 11/23/12 (from the past 48 hour(s))  TSH     Status: None   Collection Time    11/23/12  7:10 PM      Result Value Range   TSH 0.629  0.350 - 4.500 uIU/mL   Comment: Performed at [facility]    Physical Findings: AIMS: Facial and Oral Movements Muscles of Facial Expression: None, normal Lips and Perioral Area: None, normal Jaw: None, normal Tongue: None, normal,Extremity  Movements Upper (arms, wrists, hands, fingers): None, normal Lower (legs, knees, ankles, toes): None, normal, Trunk Movements Neck, shoulders, hips: None, normal, Overall Severity Severity of abnormal movements (highest score from questions above): None, normal Incapacitation due to abnormal movements: None, normal Patient's awareness of abnormal movements (rate only patient's report): No Awareness, Dental Status Current problems with teeth and/or dentures?: No Does patient usually wear dentures?: No  CIWA:  CIWA-Ar Total: 0 COWS:  COWS Total Score: 0  Treatment Plan Summary: Daily contact with patient to assess and evaluate symptoms and progress in treatment Medication management  Plan:1. Admit for crisis management and stabilization. 2. Medication management to reduce current symptoms to base line and improve the     patient's overall level of functioning 3. Treat health problems as indicated. 4. Develop treatment plan to decrease risk of relapse upon discharge and the need for     readmission. 5. Psycho-social education regarding relapse prevention and self care. 6. Health care follow up as needed for medical problems. 7. Restart home medications where appropriate. 8. Discontinue Zyprexa 9. Haldol 5mg  po BID for psychosis 10. Tegretol 200mg  po BID for mood lability   Medical Decision Making Problem Points:  Established problem, worsening (2), Review of last therapy session (1) and Review of psycho-social stressors (1) Data Points:  Order Aims Assessment (2) Review of medication regiment & side effects (2) Review of new medications or change in dosage (2)  I certify that inpatient services furnished can reasonably be expected to improve the patient's condition.   Thedore Mins, MD 11/25/2012, 11:20 AM

## 2012-11-25 NOTE — Tx Team (Signed)
  Interdisciplinary Treatment Plan Update   Date Reviewed:  11/25/2012  Time Reviewed:  8:20 AM  Progress in Treatment:   Attending groups: Yes Participating in groups: Yes Taking medication as prescribed: Yes  Tolerating medication: Yes Family/Significant other contact made: No Patient understands diagnosis: Yes  As evidenced by asking for help with anger, psychosis Discussing patient identified problems/goals with staff: Yes  See initial care plan Medical problems stabilized or resolved: Yes Denies suicidal/homicidal ideation: Yes  In tx team Patient has not harmed self or others: Yes  For review of initial/current patient goals, please see plan of care.  Estimated Length of Stay:  4-5 days  Reason for Continuation of Hospitalization: Medication Management Hallucinations Anger issues  New Problems/Goals identified:  N/A  Discharge Plan or Barriers:   return home, follow up outpt  Additional Comments:  27 year old male with past medical history of esophageal disease, DDD, Barrett's esophagus presenting to the emergency department with increased anger that has been ongoing since December 2013 when the patient's father unfortunately passed away-reported that the anger has increased over the past couple of weeks. Patient reports he gets easily agitated, constantly on edge. Reports that he has bad road rage, curses people out, gets into fights both verbally and physically very easily. Patient reports that he lives with his mother and fiance, reported that mother's health is not doing well. Patient reports that he had thoughts of suicide yesterday, attempted suicide with tying a rope around his neck then tying the other end of the rope to a tree and driving away so that his head snapped off. Patient reports that he coudln't go through it with. Patient reported that this is his fifth attempt at suicide. Patient reports that he was prescribed medications that he does not take. Reported that  he does have thoughts of hurting others when he gets angry,   Attendees:  Signature: Thedore Mins, MD 11/25/2012 8:20 AM   Signature: Richelle Ito, LCSW 11/25/2012 8:20 AM  Signature: Fransisca Kaufmann, NP 11/25/2012 8:20 AM  Signature: Joslyn Devon, RN 11/25/2012 8:20 AM  Signature: Liborio Nixon, RN 11/25/2012 8:20 AM  Signature:  11/25/2012 8:20 AM  Signature:   11/25/2012 8:20 AM  Signature:    Signature:    Signature:    Signature:    Signature:    Signature:      Scribe for Treatment Team:   Richelle Ito, LCSW  11/25/2012 8:20 AM

## 2012-11-25 NOTE — [facility] Group Notes (Signed)
[facility] LCSW Group Therapy  11/25/2012 1:15 pm  Type of Therapy: Process Group Therapy  Participation Level:  Active  Participation Quality:  Appropriate  Affect:  Flat  Cognitive:  Oriented  Insight:  Improving  Engagement in Group:  Limited  Engagement in Therapy:  Limited  Modes of Intervention:  Activity, Clarification, Education, Problem-solving and Support  Summary of Progress/Problems: Today's group addressed the issue of overcoming obstacles.  Patients were asked to identify their biggest obstacle post d/c that stands in the way of their on-going success, and then problem solve as to how to manage this.  'I'm my own worst enemy."  States that he over thinks things, and puts others before himself.  Gave an example of not going to see a friend that is supportive because it makes girlfriend mad.  Says his strength is being a people person and listening well to others.  Conversely, that causes problems because it keeps hom focused on others and does not express his own frustrations.  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  Admits that the weed that he smokes regularly is a crutch to help him deal with impending death of his mother and dog, and how he plans to quit using weed at d/c.  Keith Graves 11/25/2012   1:50 PM

## 2012-11-25 NOTE — [facility] Group Notes (Signed)
[facility] LCSW Aftercare Discharge Planning Group Note   11/25/2012 8:20 AM  Participation Quality:  Engaged  Mood/Affect:  Flat  Depression Rating:  denies  Anxiety Rating:  10  Thoughts of Suicide:  No Will you contract for safety?   NA  Current AVH:  Yes  Plan for Discharge/Comments:  Khale states it was his idea to come in to the hospital.  Was hearing voices and seeing things, and also requesting help for "anger management and bad habits."  Bad habits include weed, cigarettes and "flying off the handle."  Lives with mother and girlfriend, has not worked for a year-has physical challenges with back and knees.    Transportation Means: family  Supports: family  [place], Baldo Daub

## 2012-11-25 NOTE — Progress Notes (Signed)
Recreation Therapy Notes  Date: 10.20.2014 Time: 9:30am Location: [address] Dayroom   Group Topic: Coping Skills  Goal Area(s) Addresses:  Patient will express themselves through the use of art.  Patient will identify what positive changes have been made in their lives.   Behavioral Response: Disengaged, Sleeping  Intervention: Art  Activity: Two Faces of Me. Patient was asked to depict themselves at admission and at d/c in drawing.    Education: Discharge Planning.    Education Outcome: Needs additional education.   Clinical Observations/Feedback: Patient actively engaged in activity. Patient drew a man standing in flames on one side of his paper, representing himself at admission. Patient described this as "the hell I was living through." Patient drew a man standing in the clouds with a halo on the opposing side to represent himself at d/c. Patient stated he currently feels like he is in the middle of these two drawings. Patient stated medications have helped help him feel like himself and that he knows he needs to continue to take his medications that will he return to the state he was in at admission. Patient appeared optimistic that he will continue to get better.   Marykay Lex Chayse Gracey, LRT/CTRS  Avika Carbine L 11/25/2012 11:54 AM

## 2012-11-25 NOTE — Progress Notes (Signed)
Nutrition Brief Note  Pt meets criteria for severe MALNUTRITION in the context of social/environmental circumstances as evidenced by <50% estimated energy intake with 25% weight loss in the past year per pt report.  Patient identified on the Malnutrition Screening Tool (MST) Report.  Wt Readings from Last 10 Encounters:  11/23/12 150 lb (68.04 kg)  10/07/12 160 lb (72.576 kg)  04/27/11 150 lb (68.04 kg)   Body mass index is 22.81 kg/(m^2). Patient meets criteria for normal weight based on current BMI.   Discussed intake PTA with patient and compared to intake presently.  Discussed changes in intake, if any, and encouraged adequate intake of meals and snacks. Admitted with major depressive disorder and schizoaffective disorder. Met with pt who reports eating only 1 meal/day PTA r/t limited resources. States he has lost 50 pounds unintentionally in the past year. States he has been eating all of his meals since admission. Was on nutritional supplements in the past - Ensure.    Current diet order is regular and pt is also offered choice of unit snacks mid-morning and mid-afternoon.  Pt is eating as desired.   Labs and medications reviewed.   Nutrition Dx:  Unintended wt change r/t suboptimal oral intake AEB pt report  Interventions:   Discussed the importance of nutrition and encouraged intake of food and beverages.     Supplements: Ensure Complete BID  No additional nutrition interventions warranted at this time. If nutrition issues arise, please consult RD.   Levon Hedger MS, RD, LDN [phone] Pager [phone] After Hours Pager

## 2012-11-25 NOTE — Progress Notes (Signed)
D: Patient in the dayroom on approach.  Patient states he is feeling much better.  Patient states he is learning how to deal with anger.  Patient denies SI/HI but states he is still having auditory hallucinations but states they are getting better.   A: Staff to monitor Q 15 mins for safety.  Encouragement and support offered. No scheduled medications administered per orders.  Ativan administered prn for anxiety. R: Patient remains safe on the unit.  Patient attended group tonight.  Patient visible on the unit and interacting with peers.  Patient taking administered medications.

## 2012-11-25 NOTE — Progress Notes (Signed)
Adult Psychoeducational Group Note  Date:  11/25/2012 Time:  2:40 PM  Group Topic/Focus:  Dimensions of Wellness:   The focus of this group is to introduce the topic of wellness and discuss the role each dimension of wellness plays in total health.  Participation Level:  Active  Participation Quality:  Appropriate, Sharing and Supportive  Affect:  Appropriate  Cognitive:  Alert and Appropriate  Insight: Appropriate  Engagement in Group:  Engaged and Supportive  Modes of Intervention:  Discussion and Support  Additional Comments:  Patient shared that balancing patient life is one of the hard things to chance in pt life.  Juanda Chance Jvette 11/25/2012, 2:40 PM

## 2012-11-25 NOTE — ED Provider Notes (Signed)
Medical screening examination/treatment/procedure(s) were conducted as a shared visit with non-physician practitioner(s) and myself.  I personally evaluated the patient during the encounter Pt c/o problems w anger, frustration. No SI/HI. Pt alert, anxious appearing. Labs. Psych team consult.   Suzi Roots, MD 11/25/12 [phone]

## 2012-11-25 NOTE — Progress Notes (Signed)
Patient resting quietly with eyes closed. Respirations even and unlabored. No distress noted, Q 15 minute check continues as ordered to maintain safety.  

## 2012-11-25 NOTE — Progress Notes (Signed)
D: Pt anxious this morning, fidgety and pacing hallway. Pt requested prn ativan to help with increased anxiety this morning. Pt rates anxiety 10/10. Pt endorses AVH. Pt reports seeing a dog run and hear voices that he can't make out at this time which is a good thing for him. Pt stated that the voices argue with each other. Pt states that he hear two men. Per pt, the voices make him  angry and he becomes angry with anyone. Pt denies SI/HI at this time. Pt is compliant with attending group and taking meds. A: Medications administered as ordered per MD. Verbal support given. Pt encouraged to attend groups. 15 minute checks performed for safety. R: No adverse reaction to meds verbalized by pt. Pt engaged on the milieu with other pts. Pt is receptive to treatment. Pt safety maintained.

## 2012-11-26 MED ORDER — LORAZEPAM 0.5 MG PO TABS: 0.5000 mg | ORAL_TABLET | Freq: Two times a day (BID) | ORAL | Status: DC | PRN

## 2012-11-26 MED ORDER — BUSPIRONE HCL 10 MG PO TABS
10.0000 mg | ORAL_TABLET | Freq: Two times a day (BID) | ORAL | Status: DC
  Administered 2012-11-26 – 2012-11-27 (×3): 10 mg via ORAL
  Filled 2012-11-26 (×4): qty 1
  Filled 2012-11-26: qty 2
  Filled 2012-11-26: qty 1

## 2012-11-26 NOTE — Progress Notes (Signed)
D: Patient in bed on first approach.  Patient states he was tired and he would speak to Clinical research associate when he got up.  Patient stated he did not need any medication to help him sleep tonight due to him being tired.  Patient states his medications were changed around and he is trying to get used to it.  Patient states he had learned that he needs to continue taking his medications even when he starts to feel better.  Patient denies SI/HI and denies AVH.  Patient states this is the first time in years he has not heard voices.  Patient states he is able to watch television and focus without noise in the background. A: Staff to monitor Q 15 mins for safety.  Encouragement and support offered.  Scheduled medications administered per orders. R: Patient remains safe on the unit.  Patient did not attend group tonight.  Patient visible on the unit during snack time and interacting with peers.

## 2012-11-26 NOTE — [facility] Suicide Risk Assessment (Signed)
[facility] INPATIENT:  Family/Significant Other Suicide Prevention Education  Suicide Prevention Education:  Education Completed; Charolette Forward, mother, [phone]  has been identified by the patient as the family member/significant other with whom the patient will be residing, and identified as the person(s) who will aid the patient in the event of a mental health crisis (suicidal ideations/suicide attempt).  With written consent from the patient, the family member/significant other has been provided the following suicide prevention education, prior to the and/or following the discharge of the patient.  The suicide prevention education provided includes the following:  Suicide risk factors  Suicide prevention and interventions  National Suicide Hotline telephone number  [facility] assessment telephone number  [facility] Emergency Assistance 911  [facility] and/or Residential Mobile Crisis Unit telephone number  Request made of family/significant other to:  Remove weapons (e.g., guns, rifles, knives), all items previously/currently identified as safety concern.    Remove drugs/medications (over-the-counter, prescriptions, illicit drugs), all items previously/currently identified as a safety concern.  The family member/significant other verbalizes understanding of the suicide prevention education information provided.  The family member/significant other agrees to remove the items of safety concern listed above.  Daryel Gerald B 11/26/2012, 5:37 PM

## 2012-11-26 NOTE — [facility] Group Notes (Signed)
[facility] LCSW Group Therapy  11/26/2012 , 1:15 PM   Type of Therapy:  Group Therapy  Participation Level:  Active  Participation Quality:  Attentive  Affect:  Appropriate  Cognitive:  Alert  Insight:  Improving  Engagement in Therapy:  Engaged  Modes of Intervention:  Discussion, Exploration and Socialization  Summary of Progress/Problems: Today's group focused on the term Diagnosis.  Participants were asked to define the term, and then pronounce whether it is a negative, positive or neutral term.  Kessler was engaged throughout.  He talked about diagnosis in terms of "problem-solving by a team of observers"  And shared his own personal experience about mis-diagnosis and the need to trust the process.  "But no one is perfect, least of all me."  This led to a discussion of relapse and recovery.  Daryel Gerald B 11/26/2012 , 1:15 PM

## 2012-11-26 NOTE — Progress Notes (Signed)
Adult Psychoeducational Group Note  Date:  11/26/2012 Time:  8:00 pm  Group Topic/Focus:  Wrap-Up Group:   The focus of this group is to help patients review their daily goal of treatment and discuss progress on daily workbooks.  Participation Level:  Did Not Attend   Modena Nunnery 11/26/2012, 10:37 PM

## 2012-11-26 NOTE — Progress Notes (Signed)
Patient ID: Keith Graves, male   DOB: 1985/06/20, 27 y.o.   MRN: [id number] [facility] MD Progress Note  11/26/2012 1:49 PM Rosendo Couser  MRN:  [id number] Subjective:  Patient states "I think my attitude is better. My anger is easier to control since being started on the medication. I am still getting very anxious. I've been requesting prn ativan but I'm worried I could end up abusing that. I'm worrying about the situation with my girlfriend. I know she is not good for me but I can't bring myself to break up with her because she has nowhere to go. I'm not hearing voices as often as I was."   Objective:  Patient remains anxious, easily agitated and having symptoms of psychosis. He has been frequently requesting ativan prn. Nursing notes indicates that his symptoms worsen when his girlfriend visits. Patient plans to put boundaries on his contact with her. He discussed at length today citing multiple reasons his girlfriend is negatively impacting his life but feels conflicted over ending the relationship.   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   HENT: Negative.   Eyes: Negative.   Respiratory: Negative.   Cardiovascular: Negative.   Gastrointestinal: Negative.   Genitourinary: Negative.   Musculoskeletal: Negative.   Skin: Negative.   Neurological: Negative.   Endo/Heme/Allergies: Negative.   Psychiatric/Behavioral: Positive for depression, suicidal ideas, hallucinations and substance  abuse. Negative for memory loss. The patient is nervous/anxious and has insomnia.     Blood pressure 115/76, pulse 98, temperature 98.2 F (36.8 C), temperature source Oral, resp. rate 20, height 5\' 8"  (1.727 m), weight 68.04 kg (150 lb), SpO2 99.00%.Body mass index is 22.81 kg/(m^2).  General Appearance: Fairly Groomed  Patent attorney::  Fair  Speech:  Pressured  Volume:  Normal  Mood:  Irritable  Affect:  Labile and Full Range  Thought Process:  Goal Directed  Orientation:  Full (Time, Place, and Person)  Thought Content:  Hallucinations: Auditory Visual  Suicidal Thoughts:  No  Homicidal Thoughts:  No  Memory:  Immediate;   Fair Recent;   Fair Remote;   Fair  Judgement:  Poor  Insight:  Lacking  Psychomotor Activity:  Increased  Concentration:  Fair  Recall:  Fair  Akathisia:  No  Handed:  Right  AIMS (if indicated):     Assets:  Desire for Improvement Physical Health  Sleep:  Number of Hours: 5.5   Current Medications: Current Facility-Administered Medications  Medication Dose Route Frequency Provider Last Rate Last Dose  . alum & mag hydroxide-simeth (MAALOX/MYLANTA) 200-200-20 MG/5ML suspension 30 mL  30 mL Oral Q4H PRN Earney Navy, NP      . benztropine (COGENTIN) tablet 0.5 mg  0.5 mg Oral BID Mojeed Akintayo   0.5 mg at 11/26/12 0750  . busPIRone (BUSPAR) tablet 10 mg  10 mg Oral BID Fransisca Kaufmann, NP   10 mg at 11/26/12 1248  . carbamazepine (TEGRETOL) tablet 200 mg  200 mg Oral BID PC Mojeed Akintayo   200 mg at 11/26/12 0750  . feeding supplement (ENSURE COMPLETE) (ENSURE COMPLETE)  liquid 237 mL  237 mL Oral BID BM Lavena Bullion, RD   237 mL at 11/25/12 1847  . haloperidol (HALDOL) tablet 5 mg  5 mg Oral BID Mojeed Akintayo   5 mg at 11/26/12 0750  . ibuprofen (ADVIL,MOTRIN) tablet 400 mg  400 mg Oral Q6H PRN Mojeed Akintayo   400 mg at 11/25/12 1846  . LORazepam (ATIVAN) tablet 0.5 mg  0.5 mg Oral Q12H PRN Fransisca Kaufmann, NP      . magnesium hydroxide (MILK OF  MAGNESIA) suspension 30 mL  30 mL Oral Daily PRN Earney Navy, NP      . nicotine (NICODERM CQ - dosed in mg/24 hours) patch 21 mg  21 mg Transdermal Daily Earney Navy, NP   21 mg at 11/26/12 0750  . ondansetron (ZOFRAN) tablet 4 mg  4 mg Oral Q8H PRN Earney Navy, NP      . pantoprazole (PROTONIX) EC tablet 40 mg  40 mg Oral Daily Earney Navy, NP   40 mg at 11/26/12 0750  . traZODone (DESYREL) tablet 50 mg  50 mg Oral QHS PRN Mojeed Akintayo        Lab Results:  No results found for this or any previous visit (from the past 48 hour(s)).  Physical Findings: AIMS: Facial and Oral Movements Muscles of Facial Expression: None, normal Lips and Perioral Area: None, normal Jaw: None, normal Tongue: None, normal,Extremity Movements Upper (arms, wrists, hands, fingers): None, normal Lower (legs, knees, ankles, toes): None, normal, Trunk Movements Neck, shoulders, hips: None, normal, Overall Severity Severity of abnormal movements (highest score from questions above): None, normal Incapacitation due to abnormal movements: None, normal Patient's awareness of abnormal movements (rate only patient's report): No Awareness, Dental Status Current problems with teeth and/or dentures?: No Does patient usually wear dentures?: No  CIWA:  CIWA-Ar Total: 0 COWS:  COWS Total Score: 0  Treatment Plan Summary: Daily contact with patient to assess and evaluate symptoms and progress in treatment Medication management  Plan:1. Continue crisis management and stabilization. 2. Medication management to reduce current symptoms to base line and improve the  patient's overall level of functioning 3. Treat health problems as indicated. 4. Develop treatment plan to decrease risk of relapse upon discharge and the need for readmission. 5. Psycho-social education regarding relapse prevention and self care. 6. Health care follow up as needed for medical problems. 7. Restart home medications  where appropriate. 8. Start Buspar 10 mg BID for anxiety. Decrease ativan to 0.5 mg every twelve hours as needed for anxiety.  9. Haldol 5mg  po BID for psychosis 10. Tegretol 200mg  po BID for mood lability  Medical Decision Making Problem Points:  Established problem, worsening (2), Review of last therapy session (1) and Review of psycho-social stressors (1) Data Points:  Order Aims Assessment (2) Review of medication regiment & side effects (2) Review of new medications or change in dosage (2)  I certify that inpatient services furnished can reasonably be expected to improve the patient's condition.   Fransisca Kaufmann, NP-C 11/26/2012, 1:49 PM

## 2012-11-26 NOTE — Progress Notes (Signed)
D: Pt stated this morning that the voices had increased and that his anxiety was 10/10. Pt requested ativan for agitation this morning. Pt stated that his girlfriend came to visit this morning and she was depressed which caused him to feel overwhelmed and become somewhat angry. Pt stated that he told her not to visit anymore if she cannot control her emotions because he is trying to get help with his anger and hallucinations. A: Medications administered as ordered per MD. Verbal support given. Pt encouraged to attend groups. 15 minute checks performed for safety. R: Pt safety maintained.

## 2012-11-26 NOTE — Progress Notes (Signed)
Adult Psychoeducational Group Note  Date:  11/26/2012 Time:  3:00 AM  Group Topic/Focus:  Wrap-Up Group:   The focus of this group is to help patients review their daily goal of treatment and discuss progress on daily workbooks.  Participation Level:  Active  Participation Quality:  Appropriate  Affect:  Appropriate  Cognitive:  Appropriate  Insight: Appropriate  Engagement in Group:  Engaged  Modes of Intervention:  Support  Additional Comments:  Patient attended and participated in group tonight. He reports that he slept well, he eat well and attended his groups today. For his wellness he plans to continue taking his medication even when he is feeling better.  Lita Mains [facility] 11/26/2012, 3:00 AM

## 2012-11-27 MED ORDER — NICOTINE POLACRILEX 2 MG MT GUM
CHEWING_GUM | OROMUCOSAL | Status: AC
  Filled 2012-11-27: qty 1

## 2012-11-27 MED ORDER — BENZTROPINE MESYLATE 0.5 MG PO TABS
0.5000 mg | ORAL_TABLET | Freq: Every day | ORAL | Status: DC
Start: 2012-11-27 — End: 2012-11-29
  Administered 2012-11-27 – 2012-11-28 (×2): 0.5 mg via ORAL
  Filled 2012-11-27: qty 14
  Filled 2012-11-27 (×3): qty 1

## 2012-11-27 MED ORDER — BUSPIRONE HCL 15 MG PO TABS
15.0000 mg | ORAL_TABLET | Freq: Two times a day (BID) | ORAL | Status: DC
  Administered 2012-11-27 – 2012-11-29 (×4): 15 mg via ORAL
  Filled 2012-11-27 (×2): qty 28
  Filled 2012-11-27 (×6): qty 1

## 2012-11-27 MED ORDER — HYDROXYZINE HCL 25 MG PO TABS
25.0000 mg | ORAL_TABLET | ORAL | Status: DC | PRN
  Administered 2012-11-28 – 2012-11-29 (×2): 25 mg via ORAL
  Filled 2012-11-27 (×2): qty 1

## 2012-11-27 NOTE — Progress Notes (Signed)
Adult Psychoeducational Group Note  Date:  11/27/2012 Time:  9:51 PM  Group Topic/Focus:  Wrap-Up Group:   The focus of this group is to help patients review their daily goal of treatment and discuss progress on daily workbooks.  Participation Level:  Active  Participation Quality:  Appropriate  Affect:  Appropriate  Cognitive:  Appropriate  Insight: Improving  Engagement in Group:  Engaged  Modes of Intervention:  Support  Additional Comments:  Patient attended and participated in group tonight. He reports having a good day. He went to his meals and attended his groups. For his personal development he would like to care for himself more and present himself better by grooming and wearing nicer clothing.  Lita Mains [facility] 11/27/2012, 9:51 PM

## 2012-11-27 NOTE — Progress Notes (Signed)
Recreation Therapy Notes  Date: 10.22.2014 Time: 9:30am Location: [address] Dayroom  Group Topic: Self-Esteem  Goal Area(s) Addresses:  Patient will effectively give examples of way to increase self-esteem. Patient will verbalize benefit of increased self-esteem.   Behavioral Response: Engaged, Attentive  Intervention: Air traffic controller  Activity: Patient were given the '10 Steps to Self-Esteem' worksheet and asked to identify why each step is important, as well as given examples of how they can invest in each step. 10 Steps include: 1- Know yourself, 2- Understand what makes you feel great. 3- Recognize things that get your down. 4- Set goals to achieve what you want. 5- Develop trusting friendships that make you feel good. 6- Don't be afraid to ask for help. 7- Stand up for your beliefs and values 8- Help someone else 9- Take responsibility for your own actions 10- Take good care of yourself.   Education:  Discharge Planning, Self investment  Education Outcome: Acknowledges understanding  Clinical Observations/Feedback: Patient actively engaged in group activity, giving examples for nearly every step, as well as identifying why theses steps are important. Patient spoke about step 3 being important to him, because it gives him the opportunity to be proactive instead of reactive in his life. Patient additionally spoke about the importance of step 4, stating this can help boost self esteem in two ways - giving him something to work towards and once he reaches his goal he has a sense of satisfaction. Patient additionally spoke about the importance of taking responsibility for his action because it gives him a chance to learn from himself. Patient succesfully identified ways to increase self-esteem, in addition to how self-esteem affects ability to maintain health. Patient offered support and encouragement to peers as needed.   Marykay Lex Vong Garringer, LRT/CTRS  Jearl Klinefelter 11/27/2012 12:37 PM

## 2012-11-27 NOTE — Progress Notes (Signed)
Patient ID: Keith Graves, male   DOB: August 20, 1985, 27 y.o.   MRN: [id number] [facility] MD Progress Note  11/27/2012 10:31 AM Keith Graves  MRN:  [id number] Subjective: " I have blurred vision and I did not sleep well last night." Objective: Patient reports difficulty last night due to his room mate snoring. However, he verbalizes decreased anxiety, mood swings and depressive symptoms. He denies psychosis and suicidal ideation, intent or plan. He only verbalizes that he is worries about his girlfriend's situation. Patient is compliant with his medications and other treatment modalities offered to him. 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   HENT: Negative.   Eyes: Negative.   Respiratory: Negative.   Cardiovascular: Negative.   Gastrointestinal: Negative.   Genitourinary: Negative.   Musculoskeletal: Negative.   Skin: Negative.   Neurological: Negative.   Endo/Heme/Allergies: Negative.   Psychiatric/Behavioral: Positive for depression and substance abuse. Negative for memory loss. The patient is nervous/anxious and has insomnia.     Blood pressure 131/69, pulse 103, temperature 97.5 F (36.4 C), temperature source Oral, resp. rate 16, height 5\' 8"  (1.727 m), weight 68.04 kg (150 lb), SpO2 99.00%.Body mass index is 22.81 kg/(m^2).  General Appearance: Fairly Groomed  Patent attorney::  Fair  Speech:  Pressured  Volume:  Normal  Mood:  Irritable  Affect:  Labile and Full Range  Thought  Process:  Goal Directed  Orientation:  Full (Time, Place, and Person)  Thought Content:  Hallucinations: Auditory Visual  Suicidal Thoughts:  No  Homicidal Thoughts:  No  Memory:  Immediate;   Fair Recent;   Fair Remote;   Fair  Judgement:  Poor  Insight:  Lacking  Psychomotor Activity:  Increased  Concentration:  Fair  Recall:  Fair  Akathisia:  No  Handed:  Right  AIMS (if indicated):     Assets:  Desire for Improvement Physical Health  Sleep:  Number of Hours: 5.75   Current Medications: Current Facility-Administered Medications  Medication Dose Route Frequency Provider Last Rate Last Dose  . alum & mag hydroxide-simeth (MAALOX/MYLANTA) 200-200-20 MG/5ML suspension 30 mL  30 mL Oral Q4H PRN Earney Navy, NP      . benztropine (COGENTIN) tablet 0.5 mg  0.5 mg Oral BID Eriq Hufford   0.5 mg at 11/27/12 0826  . busPIRone (BUSPAR) tablet 10 mg  10 mg Oral BID Fransisca Kaufmann, NP   10 mg at 11/27/12 0825  . carbamazepine (TEGRETOL) tablet 200 mg  200 mg Oral BID PC Darry Kelnhofer   200 mg at 11/27/12 0825  . feeding supplement (ENSURE COMPLETE) (ENSURE COMPLETE) liquid 237 mL  237 mL Oral BID BM Lavena Bullion, RD   237 mL at 11/26/12 2106  . haloperidol (HALDOL) tablet 5 mg  5 mg Oral BID Rico Massar   5 mg at 11/27/12 0826  . ibuprofen (ADVIL,MOTRIN) tablet 400 mg  400 mg Oral Q6H PRN Libbi Towner   400 mg at 11/25/12 1846  . LORazepam (ATIVAN) tablet 0.5 mg  0.5 mg Oral Q12H PRN Fransisca Kaufmann, NP      .  magnesium hydroxide (MILK OF MAGNESIA) suspension 30 mL  30 mL Oral Daily PRN Earney Navy, NP      . nicotine (NICODERM CQ - dosed in mg/24 hours) patch 21 mg  21 mg Transdermal Daily Earney Navy, NP   21 mg at 11/27/12 0827  . ondansetron (ZOFRAN) tablet 4 mg  4 mg Oral Q8H PRN Earney Navy, NP      . pantoprazole (PROTONIX) EC tablet 40 mg  40 mg Oral Daily Earney Navy, NP   40 mg at 11/27/12 0825  . traZODone (DESYREL) tablet 50 mg  50 mg  Oral QHS PRN Eldar Robitaille   50 mg at 11/27/12 0100    Lab Results:  No results found for this or any previous visit (from the past 48 hour(s)).  Physical Findings: AIMS: Facial and Oral Movements Muscles of Facial Expression: None, normal Lips and Perioral Area: None, normal Jaw: None, normal Tongue: None, normal,Extremity Movements Upper (arms, wrists, hands, fingers): None, normal Lower (legs, knees, ankles, toes): None, normal, Trunk Movements Neck, shoulders, hips: None, normal, Overall Severity Severity of abnormal movements (highest score from questions above): None, normal Incapacitation due to abnormal movements: None, normal Patient's awareness of abnormal movements (rate only patient's report): No Awareness, Dental Status Current problems with teeth and/or dentures?: No Does patient usually wear dentures?: No  CIWA:  CIWA-Ar Total: 0 COWS:  COWS Total Score: 0  Treatment Plan Summary: Daily contact with patient to assess and evaluate symptoms and progress in treatment Medication management  Plan:1. Continue crisis management and stabilization. 2. Medication management to reduce current symptoms to base line and improve the  patient's overall level of functioning 3. Treat health problems as indicated. 4. Develop treatment plan to decrease risk of relapse upon discharge and the need for readmission. 5. Psycho-social education regarding relapse prevention and self care. 6. Health care follow up as needed for medical problems. 7. Restart home medications where appropriate. 8. Increase Buspar to 15 mg BID for anxiety.  9. Haldol 5mg  po BID for psychosis 10. Tegretol 200mg  po BID for mood lability 11. Tegretol level on 11/29/12  Medical Decision Making Problem Points:  Established problem, improving (1), Review of last therapy session (1) and Review of psycho-social stressors (1) Data Points:  Order Aims Assessment (2) Review of medication regiment & side effects  (2) Review of new medications or change in dosage (2)  I certify that inpatient services furnished can reasonably be expected to improve the patient's condition.   Thedore Mins, MD 11/27/2012, 10:31 AM

## 2012-11-27 NOTE — [facility] Group Notes (Signed)
[facility] Group Therapy  11/27/2012 , 2:02 PM    Type of Therapy:  [facility] [employer] Presentation  Participation Level:  Active  Participation Quality:  Attentive  Affect:  Blunted  Cognitive:  Oriented  Insight:  Limited  Engagement in Therapy:  Engaged  Modes of Intervention:  Discussion, Education and Socialization  Summary of Progress/Problems:  Onalee Hua from [employer] came to present his recovery story and play the guitar.  Sat quietly throughout, with the exception of leaving for the bathroom at one point.  He returned.  Keith Graves 11/27/2012 , 2:02 PM

## 2012-11-27 NOTE — Progress Notes (Signed)
Patient ID: Keith Graves, male   DOB: Jul 12, 1985, 27 y.o.   MRN: [id number]  D: Patient pleasant on approach today. Reports mood has improved a lot. Very excited about not hearing the "voices" at this time. Feels this med regimen has been very good for him. Reports depression "2" on scale. Denies any SI/HI or a/v hallucinations. Treatment team working on weaning off ativan and put him on a non-addictive anxiety medication. Also is for a tegretol level in the am. A: Staff will continue to monitor on q 15 minute checks, follow treatment plan, and give meds as ordered. R: Patient cooperative and attending groups.

## 2012-11-27 NOTE — Progress Notes (Signed)
Pt was sound asleep in his room when nurse went to introduce herself. Pt appeared comfortable with regular respirations. Does not appear to be in any distress.

## 2012-11-27 NOTE — BHH Group Notes (Signed)
[facility] LCSW Aftercare Discharge Planning Group Note   11/27/2012 1:54 PM  Participation Quality:  engaged  Mood/Affect:  Appropriate  Depression Rating:  denies  Anxiety Rating:  denies  Thoughts of Suicide:  No Will you contract for safety?   NA  Current AVH:  No  Plan for Discharge/Comments:  Keith Graves's mood is good.  He is laughing and joking.  He is not demonstrated any angry outbursts while here.  He agrees with his mother's synopsis that he does much better when on his meds.  He requested a referral to a therapist at d/c.  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  Transportation Means: mother  Supports: mother   [name]

## 2012-11-27 NOTE — Progress Notes (Signed)
Adult Psychoeducational Group Note  Date:  11/27/2012 Time:  11:00AM Group Topic/Focus:  Personal Choices and Values:   The focus of this group is to help patients assess and explore the importance of values in their lives, how their values affect their decisions, how they express their values and what opposes their expression.  Participation Level:  Active  Participation Quality:  Appropriate and Attentive  Affect:  Appropriate  Cognitive:  Alert and Appropriate  Insight: Appropriate  Engagement in Group:  Engaged  Modes of Intervention:  Discussion  Additional Comments:  Pt. Was attentive and appropriate during today's group discussion. Pt was able to discuss values and crisis plans.   Bing Plume D 11/27/2012, 12:17 PM

## 2012-11-27 NOTE — Progress Notes (Signed)
Seen and agreed. Vinton Layson, MD 

## 2012-11-28 LAB — CARBAMAZEPINE LEVEL, TOTAL: Carbamazepine Lvl: 6.8 ug/mL (ref 4.0–12.0)

## 2012-11-28 MED ORDER — HALOPERIDOL 5 MG PO TABS
5.0000 mg | ORAL_TABLET | Freq: Every day | ORAL | Status: DC
  Filled 2012-11-28: qty 14
  Filled 2012-11-28: qty 1

## 2012-11-28 NOTE — Progress Notes (Signed)
Keith Graves [facility] Adult Case Management Discharge Plan :  Will you be returning to the same living situation after discharge: Yes,  home At discharge, do you have transportation home?:Yes,  mother Do you have the ability to pay for your medications:Yes,  mental health  Release of information consent forms completed and in the chart;  Patient's signature needed at discharge.  Patient to Follow up at: Follow-up Information   Follow up with [facility]. (Go to the walk-in clinic between 8 and 9AM for your hospital follow up appointment)    Contact information:   [address]  [place]  [phone]      Follow up with [facility] On 12/03/2012. (At 9am.  Go see Elroy Channel for your individual therapy session.  )    Contact information:   [address]      Patient denies SI/HI:   Yes,  yes    Safety Planning and Suicide Prevention discussed:  Yes,  yes  Keith Graves 11/28/2012, 8:31 AM

## 2012-11-28 NOTE — Progress Notes (Signed)
D:  Patient up and visible in the milieu much of the day.  Has been out of bed more today and has been in the dayroom interacting some with peers.  He denies any symptoms of depression or hopelessness.  He also denies any thoughts of self harm or harm to others.  A:  Medications given as prescribed.  Encouraged participation in all groups.  Offered support and encouragement.  R:  Cooperative with staff.  Interacting well with peers.  Tolerating medications well.  Safety is maintained.

## 2012-11-28 NOTE — Progress Notes (Signed)
Patient ID: Keith Graves, male   DOB: 1985/10/14, 27 y.o.   MRN: [id number] [facility] MD Progress Note  11/28/2012 12:58 PM Keith Graves  MRN:  [id number] Subjective:  Patient states "I am doing better. I've just been sleeping during the day and then I can't sleep at night. I know I need to get up. I think my mood is more stable since I came here."   Objective: Patient verbalizes decreased anxiety, mood swings and depressive symptoms. He denies psychosis and suicidal ideation, intent or plan. He only verbalizes that he is worries about his girlfriend's situation. Patient has been compliant with his treatment and has made progress in symptoms since his admission.   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   HENT: Negative.   Eyes: Positive for blurred vision (Patient reports blurred vision that is slowly decreasing with recent medication changes. ).  Respiratory: Negative.   Cardiovascular: Negative.   Gastrointestinal: Negative.   Genitourinary: Negative.   Musculoskeletal: Negative.   Skin: Negative.   Neurological: Negative.   Endo/Heme/Allergies: Negative.   Psychiatric/Behavioral: Positive for depression and substance abuse. Negative for suicidal ideas, hallucinations and memory loss. The patient is nervous/anxious and has insomnia.     Blood pressure 122/80, pulse 91, temperature 97.6 F (36.4 C), temperature source Oral, resp. rate 18, height 5\' 8"  (1.727 m), weight 68.04 kg (150  lb), SpO2 99.00%.Body mass index is 22.81 kg/(m^2).  General Appearance: Fairly Groomed  Patent attorney::  Fair  Speech:  Pressured  Volume:  Normal  Mood:  Irritable  Affect:  Labile and Full Range  Thought Process:  Goal Directed  Orientation:  Full (Time, Place, and Person)  Thought Content:  Rumination  Suicidal Thoughts:  No  Homicidal Thoughts:  No  Memory:  Immediate;   Fair Recent;   Fair Remote;   Fair  Judgement:  Poor  Insight:  Lacking  Psychomotor Activity:  Increased  Concentration:  Fair  Recall:  Fair  Akathisia:  No  Handed:  Right  AIMS (if indicated):     Assets:  Desire for Improvement Physical Health  Sleep:  Number of Hours: 6.5   Current Medications: Current Facility-Administered Medications  Medication Dose Route Frequency Provider Last Rate Last Dose  . alum & mag hydroxide-simeth (MAALOX/MYLANTA) 200-200-20 MG/5ML suspension 30 mL  30 mL Oral Q4H PRN Earney Navy, NP      . benztropine (COGENTIN) tablet 0.5 mg  0.5 mg Oral Q2200 Mojeed Akintayo   0.5 mg at 11/27/12 2216  . busPIRone (BUSPAR) tablet 15 mg  15 mg Oral BID Mojeed Akintayo   15 mg at 11/28/12 0823  . carbamazepine (TEGRETOL) tablet 200 mg  200 mg Oral BID PC Mojeed Akintayo   200 mg at 11/28/12 0823  . feeding supplement (ENSURE COMPLETE) (ENSURE COMPLETE) liquid 237 mL  237 mL Oral BID BM Lavena Bullion, RD   237 mL at 11/27/12 2217  . [START ON 11/29/2012] haloperidol (HALDOL) tablet 5 mg  5 mg Oral QHS Mojeed Akintayo      . hydrOXYzine (ATARAX/VISTARIL) tablet  25 mg  25 mg Oral Q4H PRN Mojeed Akintayo      . ibuprofen (ADVIL,MOTRIN) tablet 400 mg  400 mg Oral Q6H PRN Mojeed Akintayo   400 mg at 11/28/12 1200  . magnesium hydroxide (MILK OF MAGNESIA) suspension 30 mL  30 mL Oral Daily PRN Earney Navy, NP      . nicotine (NICODERM CQ - dosed in mg/24 hours) patch 21 mg  21 mg Transdermal Daily Earney Navy, NP   21 mg at 11/28/12 0823  . ondansetron (ZOFRAN) tablet 4 mg   4 mg Oral Q8H PRN Earney Navy, NP      . pantoprazole (PROTONIX) EC tablet 40 mg  40 mg Oral Daily Earney Navy, NP   40 mg at 11/28/12 0823  . traZODone (DESYREL) tablet 50 mg  50 mg Oral QHS PRN Mojeed Akintayo   50 mg at 11/28/12 0113    Lab Results:  Results for orders placed during the hospital encounter of 11/23/12 (from the past 48 hour(s))  CARBAMAZEPINE LEVEL, TOTAL     Status: None   Collection Time    11/28/12  6:10 AM      Result Value Range   Carbamazepine Lvl 6.8  4.0 - 12.0 ug/mL   Comment: Performed at [facility]    Physical Findings: AIMS: Facial and Oral Movements Muscles of Facial Expression: None, normal Lips and Perioral Area: None, normal Jaw: None, normal Tongue: None, normal,Extremity Movements Upper (arms, wrists, hands, fingers): None, normal Lower (legs, knees, ankles, toes): None, normal, Trunk Movements Neck, shoulders, hips: None, normal, Overall Severity Severity of abnormal movements (highest score from questions above): None, normal Incapacitation due to abnormal movements: None, normal Patient's awareness of abnormal movements (rate only patient's report): No Awareness, Dental Status Current problems with teeth and/or dentures?: No Does patient usually wear dentures?: No  CIWA:  CIWA-Ar Total: 0 COWS:  COWS Total Score: 0  Treatment Plan Summary: Daily contact with patient to assess and evaluate symptoms and progress in treatment Medication management  Plan:1. Continue crisis management and stabilization. 2. Medication management to reduce current symptoms to base line and improve the patient's overall level of functioning 3. Treat health problems as indicated. 4. Develop treatment plan to decrease risk of relapse upon discharge and the need for readmission. 5. Psycho-social education regarding relapse prevention and self care. 6. Health care follow up as needed for medical problems. 7. Restart home medications where  appropriate. 8. Continue Buspar to 15 mg BID for anxiety.  9. Decrease Haldol to 5 mg hs for psychosis due to complaints of sedation from patient. Decrease Cogentin to 0.5 mg at 10 pm duet to complaints of blurred vision.  10. Tegretol 200mg  po BID for mood lability 11. Tegretol level on 11/29/12  Medical Decision Making Problem Points:  Established problem, improving (1), Review of last therapy session (1) and Review of psycho-social stressors (1) Data Points:  Order Aims Assessment (2) Review of medication regiment & side effects (2) Review of new medications or change in dosage (2)  I certify that inpatient services furnished can reasonably be expected to improve the patient's condition.   Fransisca Kaufmann, NP-C 11/28/2012, 12:58 PM

## 2012-11-28 NOTE — Tx Team (Signed)
  Interdisciplinary Treatment Plan Update   Date Reviewed:  11/28/2012  Time Reviewed:  8:29 AM  Progress in Treatment:   Attending groups: Yes Participating in groups: Yes Taking medication as prescribed: Yes  Tolerating medication: Yes Family/Significant other contact made: Yes  Patient understands diagnosis: Yes  Discussing patient identified problems/goals with staff: Yes Medical problems stabilized or resolved: Yes Denies suicidal/homicidal ideation: Yes Patient has not harmed self or others: Yes  For review of initial/current patient goals, please see plan of care.  Estimated Length of Stay:  D/C tomorrow  Reason for Continuation of Hospitalization:   New Problems/Goals identified:  N/A  Discharge Plan or Barriers:   return home, follow up outpt  Additional Comments:  Attendees:  Signature: Thedore Mins, MD 11/28/2012 8:29 AM   Signature: Richelle Ito, LCSW 11/28/2012 8:29 AM  Signature: Fransisca Kaufmann, NP 11/28/2012 8:29 AM  Signature: Joslyn Devon, RN 11/28/2012 8:29 AM  Signature: Liborio Nixon, RN 11/28/2012 8:29 AM  Signature:  11/28/2012 8:29 AM  Signature:   11/28/2012 8:29 AM  Signature:    Signature:    Signature:    Signature:    Signature:    Signature:      Scribe for Treatment Team:   Richelle Ito, LCSW  11/28/2012 8:29 AM

## 2012-11-28 NOTE — BHH Group Notes (Signed)
B[facility] Group Notes:  (Counselor/Nursing/MHT/Case Management/Adjunct)  11/28/2012 1:15PM  Type of Therapy:  Group Therapy  Participation Level:  Active  Participation Quality:  Appropriate  Affect:  Flat  Cognitive:  Oriented  Insight:  Improving  Engagement in Group:  Limited  Engagement in Therapy:  Limited  Modes of Intervention:  Discussion, Exploration and Socialization  Summary of Progress/Problems: The topic for group was balance in life.  Pt participated in the discussion about when their life was in balance and out of balance and how this feels.  Pt discussed ways to get back in balance and short term goals they can work on to get where they want to be. Keith Graves talked about feeling unbalanced because he just got the news that his dog had been put down due to cancer.  Described range of emotions, including anger, sadness, relief.     Keith Graves 11/28/2012 1:45 PM

## 2012-11-29 MED ORDER — TRAZODONE HCL 50 MG PO TABS: 50.0000 mg | ORAL_TABLET | Freq: Every evening | ORAL | Status: AC | PRN

## 2012-11-29 MED ORDER — BENZTROPINE MESYLATE 0.5 MG PO TABS: 0.5000 mg | ORAL_TABLET | Freq: Every day | ORAL | Status: DC

## 2012-11-29 MED ORDER — BUSPIRONE HCL 15 MG PO TABS: 15.0000 mg | ORAL_TABLET | Freq: Two times a day (BID) | ORAL | Status: DC

## 2012-11-29 MED ORDER — ESOMEPRAZOLE MAGNESIUM 20 MG PO CPDR: 20.0000 mg | DELAYED_RELEASE_CAPSULE | Freq: Every day | ORAL | Status: DC

## 2012-11-29 MED ORDER — CARBAMAZEPINE 200 MG PO TABS
200.0000 mg | ORAL_TABLET | Freq: Two times a day (BID) | ORAL | Status: DC
Start: 2012-11-29 — End: 2016-12-25

## 2012-11-29 MED ORDER — HALOPERIDOL 5 MG PO TABS: 5.0000 mg | ORAL_TABLET | Freq: Every day | ORAL | Status: DC

## 2012-11-29 NOTE — Discharge Summary (Signed)
Physician Discharge Summary Note  Patient:  Keith Graves is an 27 y.o., male MRN:  [id number] DOB:  [birth year] Patient phone:  [phone] (home)  Patient address:   [address] [place] [address]   Date of Admission:  11/23/2012 Date of Discharge: 11/29/12  Discharge Diagnoses: Principal Problem:   Schizoaffective disorder Active Problems:   Suicide attempt   Cannabis abuse  Axis Diagnosis:  AXIS I: Schizoaffective disorder  Cannabis use disorder severe  AXIS II: Deferred  AXIS III:  Past Medical History   Diagnosis  Date   .  Barrett's esophagus    .  Esophageal disease    .  Hernia, hiatal    .  Seizures    .  Degenerative disc disease     AXIS IV: economic problems, other psychosocial or environmental problems and problems related to social environment  AXIS V: 61-70 mild symptoms   Level of Care:  OP  Hospital Course:   On admission: 27 year old male with past medical history of esophageal disease, DDD, Barrett's esophagus presenting to the emergency department with increased anger that has been ongoing since December 2013 when the patient's father unfortunately passed away-reported that the anger has increased over the past couple of weeks. Patient reports he gets easily agitated, constantly on edge. Reports that he has bad road rage, curses people out, gets into fights both verbally and physically very easily. Patient reports that he lives with his mother and fiance, reported that mother's health is not doing well. Patient reports that he had thoughts of suicide yesterday, attempted suicide with tying a rope around his neck then tying the other end of the rope to a tree and driving away so that his head snapped off. Patient reports that he coudln't go through it with. Patient reported that this is his fifth attempt at suicide. Patient reports that he was prescribed medications that he does not take. Reported that he does have thoughts of hurting others when he gets  angry, reported that he has shortness of breath secondary to his bronchitis and smoking. Patient reports he smokes approximately 1-2 packs of cigarettes per day, at least a quarter of marijuana per day. Denied cocaine, heroin, alcohol use. Denied chest pain, difficulty breathing, headache, visual changes, weakness, abdominal pain, nausea, vomiting, changes to urinary and bowel movements.  While a patient in this hospital, Keith Graves was enrolled in group counseling and activities as well as received the following medication Current facility-administered medications:alum & mag hydroxide-simeth (MAALOX/MYLANTA) 200-200-20 MG/5ML suspension 30 mL, 30 mL, Oral, Q4H PRN, Earney Navy, NP;  benztropine (COGENTIN) tablet 0.5 mg, 0.5 mg, Oral, Q2200, Mojeed Akintayo, 0.5 mg at 11/28/12 2117;  busPIRone (BUSPAR) tablet 15 mg, 15 mg, Oral, BID, Mojeed Akintayo, 15 mg at 11/29/12 0810 carbamazepine (TEGRETOL) tablet 200 mg, 200 mg, Oral, BID PC, Mojeed Akintayo, 200 mg at 11/29/12 0810;  feeding supplement (ENSURE COMPLETE) (ENSURE COMPLETE) liquid 237 mL, 237 mL, Oral, BID BM, Lavena Bullion, RD, 237 mL at 11/27/12 2217;  haloperidol (HALDOL) tablet 5 mg, 5 mg, Oral, QHS, Mojeed Akintayo;  hydrOXYzine (ATARAX/VISTARIL) tablet 25 mg, 25 mg, Oral, Q4H PRN, Mojeed Akintayo, 25 mg at 11/29/12 0810 ibuprofen (ADVIL,MOTRIN) tablet 400 mg, 400 mg, Oral, Q6H PRN, Mojeed Akintayo, 400 mg at 11/28/12 1200;  magnesium hydroxide (MILK OF MAGNESIA) suspension 30 mL, 30 mL, Oral, Daily PRN, Earney Navy, NP;  nicotine (NICODERM CQ - dosed in mg/24 hours) patch 21 mg, 21 mg, Transdermal, Daily, Josephine C  Onuoha, NP, 21 mg at 11/29/12 0818;  ondansetron (ZOFRAN) tablet 4 mg, 4 mg, Oral, Q8H PRN, Earney Navy, NP pantoprazole (PROTONIX) EC tablet 40 mg, 40 mg, Oral, Daily, Earney Navy, NP, 40 mg at 11/29/12 0810;  traZODone (DESYREL) tablet 50 mg, 50 mg, Oral, QHS PRN, Mojeed Akintayo, 50 mg at 11/28/12  0113 The patient was started on several medications to help stabilize his symptoms. He was compliant with his medications. Patient was started on Buspar, Tegretol, and Haldol. Patient expressed a great deal of anxiety over problems with his girlfriend. He was noted to be requesting ativan prn frequently until this medication was discontinued. Patient began to express an improvement in his mood stability and felt his medication regimen was working for him. Patient was able to acknowledge that his substance abuse was a problem and interfering with his mental health. He attended groups on the unit and interacted with peers. The patient did not display any behavior problems during his admission. Patient attended treatment team meeting this am and met with treatment team members. Pt symptoms, treatment plan and response to treatment discussed. Keith Graves endorsed that their symptoms have improved. Pt also stated that they are stable for discharge.  In other to control Principal Problem:   Schizoaffective disorder Active Problems:   Suicide attempt   Cannabis abuse , they will continue psychiatric care on outpatient basis. They will follow-up at  Follow-up Information   Follow up with [facility]. (Go to the walk-in clinic between 8 and 9AM for your hospital follow up appointment)    Contact information:   [address]  [place]  [phone]      Follow up with [facility] On 12/03/2012. (At 9am.  Go see Elroy Channel for your individual therapy session.  )    Contact information:   [address]    .  In addition they were instructed to take all your medications as prescribed by your mental healthcare provider, to report any adverse effects and or reactions from your medicines to your outpatient provider promptly, patient is instructed and cautioned to not engage in alcohol and or illegal drug use while on prescription medicines, in the event of  worsening symptoms, patient is instructed to call the crisis hotline, 911 and or go to the nearest ED for appropriate evaluation and treatment of symptoms.   Upon discharge, patient adamantly denies suicidal, homicidal ideations, auditory, visual hallucinations and or delusional thinking. They left [facility] with all personal belongings in no apparent distress.  Consults:  See electronic record for details  Significant Diagnostic Studies:  See electronic record for details  Discharge Vitals:   Blood pressure 138/81, pulse 82, temperature 97.3 F (36.3 C), temperature source Oral, resp. rate 16, height 5\' 8"  (1.727 m), weight 68.04 kg (150 lb), SpO2 99.00%..  Mental Status Exam: See Mental Status Examination and Suicide Risk Assessment completed by Attending Physician prior to discharge.  Discharge destination:  Home  Is patient on multiple antipsychotic therapies at discharge:  No  Has Patient had three or more failed trials of antipsychotic monotherapy by history: N/A Recommended Plan for Multiple Antipsychotic Therapies: N/A    Medication List       Indication   benztropine 0.5 MG tablet  Commonly known as:  COGENTIN  Take 1 tablet (0.5 mg total) by mouth daily at 10 pm.   Indication:  Extrapyramidal Reaction caused by Medications     busPIRone  15 MG tablet  Commonly known as:  BUSPAR  Take 1 tablet (15 mg total) by mouth 2 (two) times daily.   Indication:  Symptoms of Feeling Anxious     carbamazepine 200 MG tablet  Commonly known as:  TEGRETOL  Take 1 tablet (200 mg total) by mouth 2 (two) times daily after a meal.   Indication:  Manic-Depression     esomeprazole 20 MG capsule  Commonly known as:  NEXIUM  Take 1 capsule (20 mg total) by mouth daily before breakfast.   Indication:  Gastroesophageal Reflux Disease with Current Symptoms     haloperidol 5 MG tablet  Commonly known as:  HALDOL  Take 1 tablet (5 mg total) by mouth at bedtime.   Indication:  Psychosis      traZODone 50 MG tablet  Commonly known as:  DESYREL  Take 1 tablet (50 mg total) by mouth at bedtime as needed for sleep.   Indication:  Trouble Sleeping           Follow-up Information   Follow up with [facility]. (Go to the walk-in clinic between 8 and 9AM for your hospital follow up appointment)    Contact information:   [address]  [place]  [phone]      Follow up with [facility] On 12/03/2012. (At 9am.  Go see Elroy Channel for your individual therapy session.  )    Contact information:   [address]     Follow-up recommendations:   Activities: Resume typical activities Diet: Resume typical diet Tests: none Other: Follow up with outpatient provider and report any side effects to out patient prescriber.  Comments:  Take all your medications as prescribed by your mental healthcare provider. Report any adverse effects and or reactions from your medicines to your outpatient provider promptly. Patient is instructed and cautioned to not engage in alcohol and or illegal drug use while on prescription medicines. In the event of worsening symptoms, patient is instructed to call the crisis hotline, 911 and or go to the nearest ED for appropriate evaluation and treatment of symptoms. Follow-up with your primary care provider for your other medical issues, concerns and or health care needs.  SignedFransisca Kaufmann NP-C 11/29/2012 10:38 AM

## 2012-11-29 NOTE — Progress Notes (Signed)
Patient ID: Keith Graves, male   DOB: 1985-07-30, 27 y.o.   MRN: [id number] Discharge orders received from MD. Pt states '' I'm so glad that I came in here, you guys really helped me. I'm going to be able to make things work now, and I know what I need to do to take care of myself. I just can't believe how close I came to really messing up my life you know? '' Patient aware of pending discharge.  AVS copy reviewed at length with writer, copy provided with follow up instructions, Rx given, 14 day free supply of medications given. Patient denies any SI/HI/A/V Hallucinations. In no acute distress, and no signs of acute decompensation. Patient verbalized understanding.  All belongings returned and letter from SW provided. Patient escorted from unit with writer to lobby to care of friend.

## 2012-11-29 NOTE — Progress Notes (Signed)
Pt did not attend Karaoke group this evening.

## 2012-11-29 NOTE — Progress Notes (Signed)
Patient ID: Keith Graves, male   DOB: [birth year], 27 y.o.   MRN: [id number] D. Patient presents with bright affect, euthymic mood. Patient states '' I'm feeling so much better, I'm looking forward to going home today. I'm not having thoughts of hurting myself, no voices now either '' Pt completed self inventory and rated depression at 0/10 on depression scale, 1 being least depressed 10 being worst depressed. Pt denies any acute concerns stating '' well [place] , so he'll be a good support '' A. Allowed pt to ventilate, support and encouragement provided. Discussed medication compliance and pt verbalized understanding. R. Patient remains calm cooperative. Will continue to monitor q 15 minutes for safety. Pending discharge today.

## 2012-11-29 NOTE — [facility] Group Notes (Signed)
[facility] LCSW Aftercare Discharge Planning Group Note   11/29/2012 9:31 AM  Participation Quality:  engaged  Mood/Affect:  Anxious and worried about Discharge, but excited to be leaving.  Depression Rating:  denies  Anxiety Rating:  denies  Thoughts of Suicide:  No Will you contract for safety?   NA  Current AVH:  No  Plan for Discharge/Comments:  Maya is prepared for DC as reported as he will follow up with [facility] and [facility] for medication. He plans to return home with his parents and fiance.     Transportation Means: Fiance will provide transport  Supports: mother and fiance.  Nail, Catalina Gravel

## 2012-11-29 NOTE — Progress Notes (Signed)
D: Pt was in bed resting with eyes closed for the majority of the shift. However, this pt was easy to arouse. He was pleasant and polite during our brief conversation. He declined his 2000 Ensure, but did consume some chocolate milk and shortbread cookies for his evening snack. He denied having any concerns he wished for this writer to address at this time. He was negative for SI/HI/AVH. His speech was logical and coherent. He was aware of the indications for his night time medications.  A: Writer administered scheduled and prn medications to pt. Continued support and availability as needed was extended to this pt. Staff continue to monitor pt with q22min checks.  R: No adverse drug reactions noted. Pt receptive to treatment. Pt remains safe at this time.

## 2012-11-29 NOTE — [facility] Group Notes (Signed)
[facility] LCSW Group Therapy  11/29/2012 3:28 PM  Type of Therapy:  Group Therapy  Participation Level:  Active  Participation Quality:  Appropriate, Attentive and Sharing  Affect:  Tearful  Cognitive:  Alert, Appropriate and Oriented  Insight:  Developing/Improving  Engagement in Therapy:  Engaged  Modes of Intervention:  Discussion, Exploration and Support  Summary of Progress/Problems:  Group today consisted of a discussion around hope and the meaning of hope to each member of group.  [place] shared that he finds hope in his mother who is battling cancer and if she can be strong during this difficult time, that gives him hope to keep pushing on.  He discussed how much his mother means to him causing him to become very emotional and tearful.  Other members of the group were very supportive and offered encouragement to him.  Members of group clapped for him as he had to leave group to be discharged.  He was very thankful to all members and shares he will never forget [facility] as this place gives him hope to go back home and help his mom.    Nail, Catalina Gravel 11/29/2012, 3:28 PM

## 2012-11-29 NOTE — [facility] Suicide Risk Assessment (Signed)
Suicide Risk Assessment  Discharge Assessment     Demographic Factors:  Male, Caucasian, Low socioeconomic status and Unemployed  Mental Status Per Nursing Assessment::   On Admission:  Suicidal ideation indicated by patient;Self-harm thoughts  Current Mental Status by Physician: patient denies suicidal ideation, intent or plan  Loss Factors: Financial problems/change in socioeconomic status  Historical Factors: Family history of mental illness or substance abuse and Impulsivity  Risk Reduction Factors:   Sense of responsibility to family, Living with another person, especially a relative and Positive social support  Continued Clinical Symptoms:  Alcohol/Substance Abuse/Dependencies  Cognitive Features That Contribute To Risk:  Closed-mindedness    Suicide Risk:  Minimal: No identifiable suicidal ideation.  Patients presenting with no risk factors but with morbid ruminations; may be classified as minimal risk based on the severity of the depressive symptoms  Discharge Diagnoses:   AXIS I:  Schizoaffective disorder              Cannabis use disorder severe AXIS II:  Deferred AXIS III:   Past Medical History  Diagnosis Date  . Barrett's esophagus   . Esophageal disease   . Hernia, hiatal   . Seizures   . Degenerative disc disease    AXIS IV:  economic problems, other psychosocial or environmental problems and problems related to social environment AXIS V:  61-70 mild symptoms  Plan Of Care/Follow-up recommendations:  Activity:  as tolerated Diet:  healthy Tests:  Carbamazepine level: 6.8 Other:  patient to keep his after care appointment  Is patient on multiple antipsychotic therapies at discharge:  No   Has Patient had three or more failed trials of antipsychotic monotherapy by history:  No  Recommended Plan for Multiple Antipsychotic Therapies: NA  Thedore Mins, MD 11/29/2012, 9:53 AM

## 2012-11-29 NOTE — Progress Notes (Signed)
Recreation Therapy Notes  Date: 10.24.2014 Time: 9:30am Location: [address] Dayroom   Group Topic: Decision Making  Goal Area(s) Addresses:  Patient will verbalize benefit of using good decision making skills. Patient will identify method of good decision making.  Behavioral Response: Engaged, Attentive, Appropriate  Intervention: Mind Mapping  Activity: Patients were asked who they would want with them if they were stranded on a deserted [place]. Patients were then asked to identify why they chose those people. Following that they were asked to identify what guided the choices they made.     Education: Decision Making, Coping Skills, Discharge Planning  Education Outcome: Acknowledges understanding  Clinical Observations/Feedback: Patient actively engaged in group activity. Patient with peers identified Elam Dutch and Sedillo as the individuals they would like to have on a deserted [place] with them. Patient able to successfully assist group with identified things like skill set, pros & cons and needs vs desires as why they chose the individuals they chose. Patient additionally was successful at helping group determine what qualities guided that process. Patient stated he struggles with good decision making because he does not feel he has time to evaluate each decision. Patient verbalized understanding of importance of using good decision making practices post d/c. Patient stated he could use this method in the future, as well as witting down a pros and cons list, as well as relying on his support system to help with his decision making.    Marykay Lex Madi Bonfiglio, LRT/CTRS  Jearl Klinefelter 11/29/2012 12:23 PM

## 2012-12-03 NOTE — Progress Notes (Signed)
Patient Discharge Instructions:  After Visit Summary (AVS):   Faxed to:  12/03/12 Discharge Summary Note:   Faxed to:  12/03/12 Psychiatric Admission Assessment Note:   Faxed to:  12/03/12 Suicide Risk Assessment - Discharge Assessment:   Faxed to:  12/03/12 Faxed/Sent to the Next Level Care provider:  12/03/12 Faxed to [facility] @ [phone] Faxed to [facility] @ [phone]  Jerelene Redden, 12/03/2012, 3:12 PM

## 2012-12-03 NOTE — Discharge Summary (Signed)
Seen and agreed. Piccola Arico, MD 

## 2012-12-03 NOTE — Progress Notes (Signed)
Seen and agreed. Demarr Kluever, MD 

## 2013-01-13 ENCOUNTER — Encounter (HOSPITAL_COMMUNITY): Payer: Self-pay | Admitting: Emergency Medicine

## 2013-01-13 ENCOUNTER — Emergency Department (HOSPITAL_COMMUNITY)
Admission: EM | Admit: 2013-01-13 | Discharge: 2013-01-13 | Disposition: A | Discharge status: Home / Self Care | Payer: No Typology Code available for payment source | Attending: Emergency Medicine | Admitting: Emergency Medicine

## 2013-01-13 ENCOUNTER — Emergency Department (HOSPITAL_COMMUNITY)
Admission: EM | Admit: 2013-01-13 | Discharge: 2013-01-13 | Disposition: A | Discharge status: Other Acute Inpatient Hospital | Payer: No Typology Code available for payment source | Source: Home / Self Care | Attending: Family Medicine | Admitting: Family Medicine

## 2013-01-13 ENCOUNTER — Emergency Department (HOSPITAL_COMMUNITY): Payer: No Typology Code available for payment source

## 2013-01-13 DIAGNOSIS — IMO0001 Reserved for inherently not codable concepts without codable children: Secondary | ICD-10-CM

## 2013-01-13 DIAGNOSIS — K219 Gastro-esophageal reflux disease without esophagitis: Secondary | ICD-10-CM | POA: Insufficient documentation

## 2013-01-13 DIAGNOSIS — R109 Unspecified abdominal pain: Secondary | ICD-10-CM

## 2013-01-13 DIAGNOSIS — R112 Nausea with vomiting, unspecified: Secondary | ICD-10-CM | POA: Insufficient documentation

## 2013-01-13 DIAGNOSIS — Z79899 Other long term (current) drug therapy: Secondary | ICD-10-CM | POA: Insufficient documentation

## 2013-01-13 DIAGNOSIS — G40909 Epilepsy, unspecified, not intractable, without status epilepticus: Secondary | ICD-10-CM | POA: Insufficient documentation

## 2013-01-13 DIAGNOSIS — K2270 Barrett's esophagus without dysplasia: Secondary | ICD-10-CM | POA: Insufficient documentation

## 2013-01-13 DIAGNOSIS — R1011 Right upper quadrant pain: Secondary | ICD-10-CM | POA: Insufficient documentation

## 2013-01-13 DIAGNOSIS — F17200 Nicotine dependence, unspecified, uncomplicated: Secondary | ICD-10-CM | POA: Insufficient documentation

## 2013-01-13 DIAGNOSIS — Z8739 Personal history of other diseases of the musculoskeletal system and connective tissue: Secondary | ICD-10-CM | POA: Insufficient documentation

## 2013-01-13 LAB — URINALYSIS, ROUTINE W REFLEX MICROSCOPIC
Bilirubin Urine: NEGATIVE
Glucose, UA: NEGATIVE mg/dL
Hgb urine dipstick: NEGATIVE
Ketones, ur: NEGATIVE mg/dL
Leukocytes, UA: NEGATIVE
Nitrite: NEGATIVE
Protein, ur: NEGATIVE mg/dL
Specific Gravity, Urine: 1.011 (ref 1.005–1.030)
Urobilinogen, UA: 0.2 mg/dL (ref 0.0–1.0)
pH: 5.5 (ref 5.0–8.0)

## 2013-01-13 LAB — CBC WITH DIFFERENTIAL/PLATELET
Basophils Absolute: 0.1 10*3/uL (ref 0.0–0.1)
Basophils Relative: 1 % (ref 0–1)
Eosinophils Absolute: 0.7 10*3/uL (ref 0.0–0.7)
Eosinophils Relative: 6 % — ABNORMAL HIGH (ref 0–5)
HCT: 47.3 % (ref 39.0–52.0)
Hemoglobin: 16.8 g/dL (ref 13.0–17.0)
Lymphocytes Relative: 32 % (ref 12–46)
Lymphs Abs: 3.8 10*3/uL (ref 0.7–4.0)
MCH: 31.2 pg (ref 26.0–34.0)
MCHC: 35.5 g/dL (ref 30.0–36.0)
MCV: 87.8 fL (ref 78.0–100.0)
Monocytes Absolute: 0.8 10*3/uL (ref 0.1–1.0)
Monocytes Relative: 7 % (ref 3–12)
Neutro Abs: 6.5 10*3/uL (ref 1.7–7.7)
Neutrophils Relative %: 55 % (ref 43–77)
Platelets: 284 10*3/uL (ref 150–400)
RBC: 5.39 MIL/uL (ref 4.22–5.81)
RDW: 13 % (ref 11.5–15.5)
WBC: 11.9 10*3/uL — ABNORMAL HIGH (ref 4.0–10.5)

## 2013-01-13 LAB — COMPREHENSIVE METABOLIC PANEL
ALT: 27 U/L (ref 0–53)
AST: 22 U/L (ref 0–37)
Albumin: 3.9 g/dL (ref 3.5–5.2)
Alkaline Phosphatase: 65 U/L (ref 39–117)
BUN: 13 mg/dL (ref 6–23)
CO2: 24 mEq/L (ref 19–32)
Calcium: 9 mg/dL (ref 8.4–10.5)
Chloride: 104 mEq/L (ref 96–112)
Creatinine, Ser: 0.92 mg/dL (ref 0.50–1.35)
GFR calc Af Amer: >90 mL/min (ref >90)
GFR calc non Af Amer: >90 mL/min (ref >90)
Glucose, Bld: 89 mg/dL (ref 70–99)
Potassium: 3.7 mEq/L (ref 3.5–5.1)
Sodium: 137 mEq/L (ref 135–145)
Total Bilirubin: 0.2 mg/dL — ABNORMAL LOW (ref 0.3–1.2)
Total Protein: 7.2 g/dL (ref 6.0–8.3)

## 2013-01-13 LAB — LIPASE, BLOOD: Lipase: 27 U/L (ref 11–59)

## 2013-01-13 MED ORDER — ONDANSETRON 4 MG PO TBDP
8.0000 mg | ORAL_TABLET | Freq: Once | ORAL | Status: AC
  Administered 2013-01-13: 8 mg via ORAL

## 2013-01-13 MED ORDER — MORPHINE SULFATE 4 MG/ML IJ SOLN
4.0000 mg | Freq: Once | INTRAMUSCULAR | Status: AC
  Administered 2013-01-13: 4 mg via INTRAVENOUS
  Filled 2013-01-13: qty 1

## 2013-01-13 MED ORDER — ONDANSETRON HCL 4 MG/2ML IJ SOLN
4.0000 mg | Freq: Once | INTRAMUSCULAR | Status: AC
  Administered 2013-01-13: 4 mg via INTRAVENOUS
  Filled 2013-01-13: qty 2

## 2013-01-13 MED ORDER — SODIUM CHLORIDE 0.9 % IV BOLUS (SEPSIS)
1000.0000 mL | Freq: Once | INTRAVENOUS | Status: AC
  Administered 2013-01-13: 1000 mL via INTRAVENOUS

## 2013-01-13 MED ORDER — ONDANSETRON 4 MG PO TBDP
ORAL_TABLET | ORAL | Status: AC
  Filled 2013-01-13: qty 2

## 2013-01-13 MED ORDER — METOCLOPRAMIDE HCL 10 MG PO TABS: 10.0000 mg | ORAL_TABLET | Freq: Four times a day (QID) | ORAL | Status: DC | PRN

## 2013-01-13 MED ORDER — DEXLANSOPRAZOLE 30 MG PO CPDR: 30.0000 mg | DELAYED_RELEASE_CAPSULE | Freq: Every day | ORAL | Status: DC

## 2013-01-13 NOTE — ED Provider Notes (Signed)
Keith Graves is a 27 y.o. male who presents to Urgent Care today for right upper quadrant abdominal pain associated with vomiting. This is been present for about 3 weeks worsening today. He notes similar episodes of this in the past that have not been successful evaluated. He does have a history of Barrett's esophagus and bilateral hernia but feels this is somewhat different than his usual discomfort. He denies any fevers or chills. The vomiting is nonbloody and nonbilious. No blood in the stool. Patient has been taking Nexium which seemed to help only a little.    Past Medical History  Diagnosis Date  . Barrett's esophagus   . Esophageal disease   . Hernia, hiatal   . Seizures   . Degenerative disc disease    History  Substance Use Topics  . Smoking status: Current Every Day Smoker -- 2.00 packs/day for 10 years    Types: Cigarettes  . Smokeless tobacco: Not on file  . Alcohol Use: No   ROS as above Medications reviewed. No current facility-administered medications for this encounter.   Current Outpatient Prescriptions  Medication Sig Dispense Refill  . benztropine (COGENTIN) 0.5 MG tablet Take 1 tablet (0.5 mg total) by mouth daily at 10 pm.  30 tablet  0  . busPIRone (BUSPAR) 15 MG tablet Take 1 tablet (15 mg total) by mouth 2 (two) times daily.  60 tablet  0  . carbamazepine (TEGRETOL) 200 MG tablet Take 1 tablet (200 mg total) by mouth 2 (two) times daily after a meal.  60 tablet  0  . esomeprazole (NEXIUM) 20 MG capsule Take 1 capsule (20 mg total) by mouth daily before breakfast.      . haloperidol (HALDOL) 5 MG tablet Take 1 tablet (5 mg total) by mouth at bedtime.  30 tablet  0  . traZODone (DESYREL) 50 MG tablet Take 1 tablet (50 mg total) by mouth at bedtime as needed for sleep.  60 tablet  0    Exam:  BP 121/79  Pulse 96  Temp(Src) 97.1 F (36.2 C)  Resp 20  SpO2 96% Gen: In pain appearing HEENT: EOMI,  MMM Lungs: Normal work of breathing. CTABL Heart: RRR no  MRG Abd: NABS, tender to palpation right upper quadrant with rebound and guarding present. Exts: Non edematous BL  LE, warm and well perfused.   No results found for this or any previous visit (from the past 24 hour(s)). No results found.  Assessment and Plan: 27 y.o. male with right upper quadrant abdominal pain. This is concerning for gallstones, hepatitis, pancreatitis. Plan to transfer to the emergency room for further evaluation and management. Discussed warning signs or symptoms. Please see discharge instructions. Patient expresses understanding.      Rodolph Bong, MD 01/13/13 [phone]

## 2013-01-13 NOTE — ED Notes (Signed)
Pt. transferred from [facility] reports upper abdominal pain with emesis and diarrhea for 3 days , denies urinary discomfort / fever or chills.

## 2013-01-13 NOTE — ED Notes (Signed)
reported history of Barrett's esophageal disease, hiatal hernia, GERD. States his pain is getting worse past 3 weeks, weakness , diarrhea;  has an appt to see [facility] in a few days, and they reportedly told him to come here for a referral to GI doctor s

## 2013-01-13 NOTE — ED Provider Notes (Signed)
CSN: [id number]     Arrival date & time 01/13/13  1619 History   First MD Initiated Contact with Patient 01/13/13 1826     Chief Complaint  Patient presents with  . Abdominal Pain   (Consider location/radiation/quality/duration/timing/severity/associated sxs/prior Treatment) The history is provided by the patient.  Keith Graves is a 27 y.o. male hx of barrett's esophagus, hiatal hernia, seizure here with RUQ pain. RUQ pain for the last three weeks. It has been intermittent, associated with vomiting, no radiation of the pain. Vomiting is nonbloody and nonbilious. Pain was more intense today so he went to urgent care and was sent in for evaluation.    Past Medical History  Diagnosis Date  . Barrett's esophagus   . Esophageal disease   . Hernia, hiatal   . Seizures   . Degenerative disc disease    Past Surgical History  Procedure Laterality Date  . Knee surgery    . Esophagoscopy      biopsy   No family history on file. History  Substance Use Topics  . Smoking status: Current Every Day Smoker -- 2.00 packs/day for 10 years    Types: Cigarettes  . Smokeless tobacco: Not on file  . Alcohol Use: No    Review of Systems  Gastrointestinal: Positive for vomiting and abdominal pain.  All other systems reviewed and are negative.    Allergies  Hydrocodone  Home Medications   Current Outpatient Rx  Name  Route  Sig  Dispense  Refill  . benztropine (COGENTIN) 0.5 MG tablet   Oral   Take 1 tablet (0.5 mg total) by mouth daily at 10 pm.   30 tablet   0   . busPIRone (BUSPAR) 15 MG tablet   Oral   Take 1 tablet (15 mg total) by mouth 2 (two) times daily.   60 tablet   0   . carbamazepine (TEGRETOL) 200 MG tablet   Oral   Take 1 tablet (200 mg total) by mouth 2 (two) times daily after a meal.   60 tablet   0   . esomeprazole (NEXIUM) 20 MG capsule   Oral   Take 1 capsule (20 mg total) by mouth daily before breakfast.         . haloperidol (HALDOL) 5 MG tablet  Oral   Take 1 tablet (5 mg total) by mouth at bedtime.   30 tablet   0   . traZODone (DESYREL) 50 MG tablet   Oral   Take 1 tablet (50 mg total) by mouth at bedtime as needed for sleep.   60 tablet   0    BP 114/75  Pulse 64  Temp(Src) 98.4 F (36.9 C) (Oral)  Resp 18  Ht 5\' 10"  (1.778 m)  Wt 178 lb (80.74 kg)  BMI 25.54 kg/m2  SpO2 98% Physical Exam  Nursing note and vitals reviewed. Constitutional: He is oriented to person, place, and time. He appears well-nourished.  Uncomfortable   HENT:  Head: Normocephalic.  Mouth/Throat: Oropharynx is clear and moist.  Eyes: Conjunctivae are normal. Pupils are equal, round, and reactive to light.  Neck: Normal range of motion. Neck supple.  Cardiovascular: Normal rate, regular rhythm and normal heart sounds.   Pulmonary/Chest: Effort normal and breath sounds normal. No respiratory distress. He has no wheezes. He has no rales.  Abdominal:  Firm, + RUQ tenderness. + murphy's sign   Musculoskeletal: Normal range of motion.  Neurological: He is alert and oriented to person, place, and  time.  Skin: Skin is warm and dry.  Psychiatric: He has a normal mood and affect. His behavior is normal. Judgment and thought content normal.    ED Course  Procedures (including critical care time) Labs Review Labs Reviewed  CBC WITH DIFFERENTIAL - Abnormal; Notable for the following:    WBC 11.9 (*)    Eosinophils Relative 6 (*)    All other components within normal limits  COMPREHENSIVE METABOLIC PANEL - Abnormal; Notable for the following:    Total Bilirubin 0.2 (*)    All other components within normal limits  LIPASE, BLOOD  URINALYSIS, ROUTINE W REFLEX MICROSCOPIC   Imaging Review US Abdomen Complete  01/13/2013   CLINICAL DATA:  Esophageal disease, history of hiatal hernia  EXAM: ULTRASOUND ABDOMEN COMPLETE  COMPARISON:  None.  FINDINGS: Gallbladder:  No gallstones or wall thickening visualized. No sonographic Murphy sign noted.  Common  bile duct:  Diameter: 3 mm  Liver:  No focal lesion identified. Within normal limits in parenchymal echogenicity.  IVC:  No abnormality visualized.  Pancreas:  Visualized portion unremarkable.  Spleen:  Size and appearance within normal limits.  Right Kidney:  Length: 12 cm. Echogenicity within normal limits. No mass or hydronephrosis visualized.  Left Kidney:  Length: 12.5 cm. Echogenicity within normal limits. No mass or hydronephrosis visualized.  Abdominal aorta:  No aneurysm visualized.  Other findings:  None.  IMPRESSION: Normal abdominal ultrasound   Electronically Signed   By: Esperanza Heir M.D.   On: 01/13/2013 19:40    EKG Interpretation   None       MDM  No diagnosis found. Keanan Melander is a 27 y.o. male here with RUQ pain. I suspect biliary colic. Will get [place] and give pain meds and reassess.   8:35 PM US showed no gallstones. Pain free now. Abdomen soft and minimally tender. Likely worsening reflux. Will increase nexium to BID, add reglan, and have him f/u with GI.    Richardean Canal, MD 01/13/13 2036

## 2013-01-13 NOTE — ED Notes (Signed)
Pt updated on wait time. Pt stated "that was fine and he understood"

## 2013-01-13 NOTE — ED Notes (Signed)
Unable to urinate in triage 

## 2013-01-13 NOTE — ED Notes (Signed)
Patient transported to Ultrasound 

## 2013-01-13 NOTE — Discharge Instructions (Signed)
Stay hydrated.   Increase nexium to 20 mg twice a day. Or you can take dexilant as prescribed. Do NOT take both.   Take reglan as needed.   Follow up with your GI doctor.   Return to ER if you have severe pain, vomiting.

## 2013-01-13 NOTE — ED Notes (Signed)
Discontinued patients IV 

## 2013-01-15 ENCOUNTER — Encounter: Payer: Self-pay | Admitting: Internal Medicine

## 2013-01-15 ENCOUNTER — Ambulatory Visit: Payer: No Typology Code available for payment source | Attending: Internal Medicine | Admitting: Internal Medicine

## 2013-01-15 VITALS — BP 115/75 | HR 83 | Temp 98.7°F | Resp 14 | Ht 70.0 in | Wt 177.4 lb

## 2013-01-15 DIAGNOSIS — R109 Unspecified abdominal pain: Secondary | ICD-10-CM

## 2013-01-15 DIAGNOSIS — K449 Diaphragmatic hernia without obstruction or gangrene: Secondary | ICD-10-CM | POA: Insufficient documentation

## 2013-01-15 NOTE — Progress Notes (Signed)
Pt is here to establish care. Requests referral for an orthopedic and gastrologist.

## 2013-01-15 NOTE — Progress Notes (Signed)
Patient ID: Keith Graves, male   DOB: 06/24/1985, 27 y.o.   MRN: [id number]   CC:  HPI: Patient is here to establish care. The patient was recently in the ED on 12/8 for abdominal pain. He states that his history of Barrett's esophagus, hiatal hernia. He is seeing Dr. Evette Cristal in the past. He states that he is at 5 endoscopies. He states that he has screws in his right knee and needs to see an orthopedic surgeon He is refusing all blood work today, to establish care. Feels he does not need it.  Allergies  Allergen Reactions  . Hydrocodone Nausea And Vomiting   Past Medical History  Diagnosis Date  . Barrett's esophagus   . Esophageal disease   . Hernia, hiatal   . Seizures   . Degenerative disc disease    Current Outpatient Prescriptions on File Prior to Visit  Medication Sig Dispense Refill  . benztropine (COGENTIN) 0.5 MG tablet Take 1 tablet (0.5 mg total) by mouth daily at 10 pm.  30 tablet  0  . busPIRone (BUSPAR) 15 MG tablet Take 1 tablet (15 mg total) by mouth 2 (two) times daily.  60 tablet  0  . carbamazepine (TEGRETOL) 200 MG tablet Take 1 tablet (200 mg total) by mouth 2 (two) times daily after a meal.  60 tablet  0  . Dexlansoprazole (DEXILANT) 30 MG capsule Take 1 capsule (30 mg total) by mouth daily.  30 capsule  0  . esomeprazole (NEXIUM) 20 MG capsule Take 1 capsule (20 mg total) by mouth daily before breakfast.      . haloperidol (HALDOL) 5 MG tablet Take 1 tablet (5 mg total) by mouth at bedtime.  30 tablet  0  . metoCLOPramide (REGLAN) 10 MG tablet Take 1 tablet (10 mg total) by mouth every 6 (six) hours as needed for nausea (nausea/headache).  15 tablet  0  . traZODone (DESYREL) 50 MG tablet Take 1 tablet (50 mg total) by mouth at bedtime as needed for sleep.  60 tablet  0   No current facility-administered medications on file prior to visit.   Family History  Problem Relation Age of Onset  . Hypertension Father    History   Social History  . Marital Status:  Single    Spouse Name: N/A    Number of Children: N/A  . Years of Education: N/A   Occupational History  . Not on file.   Social History Main Topics  . Smoking status: Current Every Day Smoker -- 2.00 packs/day for 10 years    Types: Cigarettes  . Smokeless tobacco: Not on file  . Alcohol Use: No  . Drug Use: Yes    Special: Marijuana  . Sexual Activity: Not on file   Other Topics Concern  . Not on file   Social History Narrative  . No narrative on file    Review of Systems  Constitutional: Negative for fever, chills, diaphoresis, activity change, appetite change and fatigue.  HENT: Negative for ear pain, nosebleeds, congestion, facial swelling, rhinorrhea, neck pain, neck stiffness and ear discharge.   Eyes: Negative for pain, discharge, redness, itching and visual disturbance.  Respiratory: Negative for cough, choking, chest tightness, shortness of breath, wheezing and stridor.   Cardiovascular: Negative for chest pain, palpitations and leg swelling.  Gastrointestinal: Negative for abdominal distention.  Genitourinary: Negative for dysuria, urgency, frequency, hematuria, flank pain, decreased urine volume, difficulty urinating and dyspareunia.  Musculoskeletal: Negative for back pain, joint swelling, arthralgias and  gait problem.  Neurological: Negative for dizziness, tremors, seizures, syncope, facial asymmetry, speech difficulty, weakness, light-headedness, numbness and headaches.  Hematological: Negative for adenopathy. Does not bruise/bleed easily.  Psychiatric/Behavioral: Negative for hallucinations, behavioral problems, confusion, dysphoric mood, decreased concentration and agitation.    Objective:   Filed Vitals:   01/15/13 1521  BP: 115/75  Pulse: 83  Temp: 98.7 F (37.1 C)  Resp: 14    Physical Exam  Constitutional: Appears well-developed and well-nourished. No distress.  HENT: Normocephalic. External right and left ear normal. Oropharynx is clear and  moist.  Eyes: Conjunctivae and EOM are normal. PERRLA, no scleral icterus.  Neck: Normal ROM. Neck supple. No JVD. No tracheal deviation. No thyromegaly.  CVS: RRR, S1/S2 +, no murmurs, no gallops, no carotid bruit.  Pulmonary: Effort and breath sounds normal, no stridor, rhonchi, wheezes, rales.  Abdominal: Soft. BS +,  no distension, tenderness, rebound or guarding.  Musculoskeletal: Normal range of motion. No edema and no tenderness.  Lymphadenopathy: No lymphadenopathy noted, cervical, inguinal. Neuro: Alert. Normal reflexes, muscle tone coordination. No cranial nerve deficit. Skin: Skin is warm and dry. No rash noted. Not diaphoretic. No erythema. No pallor.  Psychiatric: Normal mood and affect. Behavior, judgment, thought content normal.   Lab Results  Component Value Date   WBC 11.9* 01/13/2013   HGB 16.8 01/13/2013   HCT 47.3 01/13/2013   MCV 87.8 01/13/2013   PLT 284 01/13/2013   Lab Results  Component Value Date   CREATININE 0.92 01/13/2013   BUN 13 01/13/2013   NA 137 01/13/2013   K 3.7 01/13/2013   CL 104 01/13/2013   CO2 24 01/13/2013    No results found for this basename: HGBA1C   Lipid Panel  No results found for this basename: chol, trig, hdl, cholhdl, vldl, ldlcalc       Assessment and plan:   Patient Active Problem List   Diagnosis Date Noted  . Schizoaffective disorder 11/24/2012  . Suicide attempt 11/24/2012  . Cannabis abuse 11/24/2012   Establish care Patient refusing blood work Gastroenterology referral provided Orthopedic referral provided Refusing flu vaccination Follow up in 6 months      The patient was given clear instructions to go to ER or return to medical center if symptoms don't improve, worsen or new problems develop. The patient verbalized understanding. The patient was told to call to get any lab results if not heard anything in the next week.

## 2013-01-17 ENCOUNTER — Telehealth: Payer: Self-pay | Admitting: Internal Medicine

## 2013-01-17 NOTE — Telephone Encounter (Signed)
Pt called in today to clear up a misunderstanding about the status of Keith Graves referrals; pt stated that the orthopedics office may have received his Gastro referral and vice versa; please check the status of both referrals to be sure

## 2013-01-20 ENCOUNTER — Ambulatory Visit (INDEPENDENT_AMBULATORY_CARE_PROVIDER_SITE_OTHER): Payer: No Typology Code available for payment source | Admitting: Family Medicine

## 2013-01-20 ENCOUNTER — Encounter: Payer: Self-pay | Admitting: Family Medicine

## 2013-01-20 VITALS — BP 129/83 | HR 101 | Ht 70.0 in | Wt 180.0 lb

## 2013-01-20 DIAGNOSIS — M549 Dorsalgia, unspecified: Secondary | ICD-10-CM

## 2013-01-20 DIAGNOSIS — M25569 Pain in unspecified knee: Secondary | ICD-10-CM

## 2013-01-20 DIAGNOSIS — M25561 Pain in right knee: Secondary | ICD-10-CM

## 2013-01-20 DIAGNOSIS — G8929 Other chronic pain: Secondary | ICD-10-CM

## 2013-01-20 NOTE — Patient Instructions (Signed)
We will obtain your records from both offices before moving forward. Call me Friday if you haven't heard from me to ensure we got your records.

## 2013-01-22 ENCOUNTER — Encounter: Payer: Self-pay | Admitting: Family Medicine

## 2013-01-22 DIAGNOSIS — G8929 Other chronic pain: Secondary | ICD-10-CM | POA: Insufficient documentation

## 2013-01-22 DIAGNOSIS — M25561 Pain in right knee: Secondary | ICD-10-CM | POA: Insufficient documentation

## 2013-01-22 NOTE — Assessment & Plan Note (Signed)
chronic also.  Last MRI of record in 2009 reassuring.  Will also obtain records, other MRI reports for this before proceeding.
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
## 2013-01-22 NOTE — Assessment & Plan Note (Signed)
will need to obtain copies of his prior MRIs and records before proceeding given the length of time he's had symptoms.  No red flags.  Does have weakness of left hip flexion - will see if he has known disc herniation on left side.

## 2013-03-12 ENCOUNTER — Telehealth: Payer: Self-pay | Admitting: Internal Medicine

## 2013-03-12 NOTE — Telephone Encounter (Signed)
Pt says he has tried several ways to stop smoking but has not been able to sustain efforts very long, would like a script for a smoking cessation med like Chantix but wants to know if he needs to come back in for appointment to do that or if its something that can be prescribed by looking at medical chart. Please f/u with pt.

## 2013-03-13 ENCOUNTER — Telehealth: Payer: Self-pay | Admitting: Emergency Medicine

## 2013-03-13 ENCOUNTER — Telehealth: Payer: Self-pay | Admitting: Internal Medicine

## 2013-03-13 NOTE — Telephone Encounter (Signed)
Message sent to Dr. Susie CassetteAbrol for the ok

## 2013-03-13 NOTE — Telephone Encounter (Signed)
Pt requesting Chantix. Is this ok to prescribe with other meds?

## 2013-03-13 NOTE — Telephone Encounter (Signed)
Pt is calling in today to get advice on wether he needs to come in for a visit to get medication to stop smoking; please f/u with pt

## 2013-03-17 ENCOUNTER — Other Ambulatory Visit: Payer: Self-pay | Admitting: Emergency Medicine

## 2013-03-17 ENCOUNTER — Telehealth: Payer: Self-pay

## 2013-03-17 MED ORDER — VARENICLINE TARTRATE 0.5 MG PO TABS: 0.5000 mg | ORAL_TABLET | Freq: Two times a day (BID) | ORAL | Status: DC

## 2013-03-17 NOTE — Telephone Encounter (Signed)
Patient not available Left message on voice mail to return our call 

## 2013-03-17 NOTE — Telephone Encounter (Signed)
Family member called to just verify chantix was in fact  Sent to the [facility] pharmacy on file

## 2013-03-28 ENCOUNTER — Ambulatory Visit: Payer: Self-pay

## 2013-06-11 ENCOUNTER — Emergency Department (HOSPITAL_BASED_OUTPATIENT_CLINIC_OR_DEPARTMENT_OTHER): Payer: No Typology Code available for payment source

## 2013-06-11 ENCOUNTER — Encounter (HOSPITAL_BASED_OUTPATIENT_CLINIC_OR_DEPARTMENT_OTHER): Payer: Self-pay | Admitting: Emergency Medicine

## 2013-06-11 ENCOUNTER — Emergency Department (HOSPITAL_BASED_OUTPATIENT_CLINIC_OR_DEPARTMENT_OTHER)
Admission: EM | Admit: 2013-06-11 | Discharge: 2013-06-11 | Discharge status: Against Medical Advice | Payer: No Typology Code available for payment source | Attending: Emergency Medicine | Admitting: Emergency Medicine

## 2013-06-11 DIAGNOSIS — F319 Bipolar disorder, unspecified: Secondary | ICD-10-CM | POA: Insufficient documentation

## 2013-06-11 DIAGNOSIS — Y92009 Unspecified place in unspecified non-institutional (private) residence as the place of occurrence of the external cause: Secondary | ICD-10-CM | POA: Insufficient documentation

## 2013-06-11 DIAGNOSIS — M25579 Pain in unspecified ankle and joints of unspecified foot: Secondary | ICD-10-CM | POA: Insufficient documentation

## 2013-06-11 DIAGNOSIS — K2270 Barrett's esophagus without dysplasia: Secondary | ICD-10-CM | POA: Insufficient documentation

## 2013-06-11 DIAGNOSIS — M79673 Pain in unspecified foot: Secondary | ICD-10-CM

## 2013-06-11 DIAGNOSIS — Z79899 Other long term (current) drug therapy: Secondary | ICD-10-CM | POA: Insufficient documentation

## 2013-06-11 DIAGNOSIS — Y9389 Activity, other specified: Secondary | ICD-10-CM | POA: Insufficient documentation

## 2013-06-11 DIAGNOSIS — F17200 Nicotine dependence, unspecified, uncomplicated: Secondary | ICD-10-CM | POA: Insufficient documentation

## 2013-06-11 DIAGNOSIS — G40909 Epilepsy, unspecified, not intractable, without status epilepticus: Secondary | ICD-10-CM | POA: Insufficient documentation

## 2013-06-11 DIAGNOSIS — S060XAA Concussion with loss of consciousness status unknown, initial encounter: Secondary | ICD-10-CM

## 2013-06-11 DIAGNOSIS — S060X1A Concussion with loss of consciousness of 30 minutes or less, initial encounter: Secondary | ICD-10-CM | POA: Insufficient documentation

## 2013-06-11 DIAGNOSIS — W208XXA Other cause of strike by thrown, projected or falling object, initial encounter: Secondary | ICD-10-CM | POA: Insufficient documentation

## 2013-06-11 DIAGNOSIS — S060X9A Concussion with loss of consciousness of unspecified duration, initial encounter: Secondary | ICD-10-CM

## 2013-06-11 HISTORY — DX: Bipolar disorder, unspecified: F31.9
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
## 2013-06-11 NOTE — ED Notes (Signed)
Superficial laceration to scalp with bleeding controlled
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
## 2013-06-11 NOTE — ED Notes (Signed)
Patient transported to X-ray via stretcher per tech. 

## 2013-06-11 NOTE — ED Notes (Signed)
Pt ambulatory to restroom

## 2015-11-08 ENCOUNTER — Ambulatory Visit (INDEPENDENT_AMBULATORY_CARE_PROVIDER_SITE_OTHER): Payer: Self-pay | Admitting: Orthopedic Surgery

## 2015-11-22 ENCOUNTER — Ambulatory Visit (INDEPENDENT_AMBULATORY_CARE_PROVIDER_SITE_OTHER): Payer: Self-pay | Admitting: Orthopedic Surgery

## 2016-03-24 ENCOUNTER — Encounter: Payer: Self-pay | Admitting: Emergency Medicine

## 2016-03-24 ENCOUNTER — Emergency Department
Admission: EM | Admit: 2016-03-24 | Discharge: 2016-03-25 | Disposition: A | Discharge status: Home / Self Care | Payer: 59 | Attending: Emergency Medicine | Admitting: Emergency Medicine

## 2016-03-24 DIAGNOSIS — Y999 Unspecified external cause status: Secondary | ICD-10-CM | POA: Diagnosis not present

## 2016-03-24 DIAGNOSIS — S0990XA Unspecified injury of head, initial encounter: Secondary | ICD-10-CM | POA: Diagnosis present

## 2016-03-24 DIAGNOSIS — W2209XA Striking against other stationary object, initial encounter: Secondary | ICD-10-CM | POA: Insufficient documentation

## 2016-03-24 DIAGNOSIS — S060X9A Concussion with loss of consciousness of unspecified duration, initial encounter: Secondary | ICD-10-CM | POA: Diagnosis not present

## 2016-03-24 DIAGNOSIS — F17210 Nicotine dependence, cigarettes, uncomplicated: Secondary | ICD-10-CM | POA: Diagnosis not present

## 2016-03-24 DIAGNOSIS — Z79899 Other long term (current) drug therapy: Secondary | ICD-10-CM | POA: Insufficient documentation

## 2016-03-24 DIAGNOSIS — Y929 Unspecified place or not applicable: Secondary | ICD-10-CM | POA: Diagnosis not present

## 2016-03-24 DIAGNOSIS — Y939 Activity, unspecified: Secondary | ICD-10-CM | POA: Insufficient documentation

## 2016-03-24 NOTE — ED Notes (Signed)
Pt assisted to BR and unable to give urine sample at this time.
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
## 2016-03-24 NOTE — ED Notes (Signed)
Consulted with Dr. Derrill KayGoodman, no orders at this time

## 2016-03-25 ENCOUNTER — Emergency Department: Payer: 59
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
## 2016-03-25 NOTE — ED Notes (Signed)

## 2016-03-25 NOTE — ED Notes (Signed)
Pt's so states "what is going on, what is the hold up?" pt's so then states "he wants to leave it's taking so long." so informed to encourage pt to stay and be seen.

## 2016-03-25 NOTE — ED Notes (Signed)
Pt returned from ct. Pt has removed gown, thrown gown on treatment floor and placed self in clothing. Warm blankets provided to visitor. Pt resting with eyes closed, resps unlabored.

## 2016-12-25 ENCOUNTER — Other Ambulatory Visit: Payer: Self-pay

## 2016-12-25 ENCOUNTER — Encounter (HOSPITAL_BASED_OUTPATIENT_CLINIC_OR_DEPARTMENT_OTHER): Payer: Self-pay | Admitting: *Deleted

## 2016-12-25 ENCOUNTER — Emergency Department (HOSPITAL_BASED_OUTPATIENT_CLINIC_OR_DEPARTMENT_OTHER)
Admission: EM | Admit: 2016-12-25 | Discharge: 2016-12-25 | Disposition: A | Discharge status: Home / Self Care | Payer: 59 | Attending: Emergency Medicine | Admitting: Emergency Medicine

## 2016-12-25 DIAGNOSIS — F319 Bipolar disorder, unspecified: Secondary | ICD-10-CM | POA: Insufficient documentation

## 2016-12-25 DIAGNOSIS — H9201 Otalgia, right ear: Secondary | ICD-10-CM | POA: Diagnosis present

## 2016-12-25 DIAGNOSIS — F259 Schizoaffective disorder, unspecified: Secondary | ICD-10-CM | POA: Diagnosis not present

## 2016-12-25 DIAGNOSIS — F17210 Nicotine dependence, cigarettes, uncomplicated: Secondary | ICD-10-CM | POA: Diagnosis not present

## 2016-12-25 DIAGNOSIS — H6691 Otitis media, unspecified, right ear: Secondary | ICD-10-CM | POA: Insufficient documentation

## 2016-12-25 DIAGNOSIS — H6690 Otitis media, unspecified, unspecified ear: Secondary | ICD-10-CM

## 2016-12-25 DIAGNOSIS — F1210 Cannabis abuse, uncomplicated: Secondary | ICD-10-CM | POA: Insufficient documentation

## 2016-12-25 DIAGNOSIS — J029 Acute pharyngitis, unspecified: Secondary | ICD-10-CM | POA: Diagnosis not present

## 2016-12-25 MED ORDER — AMOXICILLIN-POT CLAVULANATE 875-125 MG PO TABS: 1.0000 | ORAL_TABLET | Freq: Two times a day (BID) | ORAL | 0 refills | Status: DC

## 2016-12-25 NOTE — ED Triage Notes (Signed)
Pt c/o URi symptoms x 1 week and then added n/v/d x 1 week

## 2016-12-25 NOTE — ED Provider Notes (Signed)
[facility] EMERGENCY DEPARTMENT Provider Note   CSN: [id number] Arrival date & time: 12/25/16  1306  History   Chief Complaint Chief Complaint  Patient presents with  . Influenza    HPI Keith Graves is a 31 y.o. male.  HPI   Patient comes to the ER with two weeks of progressively worsening right ear pain, sore throat, cough and then today he had an episode of vomiting and diarrhea which has since resolved. 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 He reports being a normally healthy person. He has been taking Nyquil at night to help him sleep.  Past Medical History:  Diagnosis Date  . Barrett's esophagus   . Bipolar 1 disorder (HCC)   . Degenerative disc disease   . Esophageal disease   . Hernia, hiatal   . Seizures [facility](HCC)     Patient Active Problem List   Diagnosis Date Noted  . Chronic back pain 01/22/2013  . Right knee pain 01/22/2013  . Schizoaffective disorder (HCC) 11/24/2012  . Suicide attempt (HCC) 11/24/2012  . Cannabis abuse 11/24/2012    Past Surgical History:  Procedure Laterality Date  . ESOPHAGOSCOPY     biopsy  . KNEE SURGERY         Home Medications    Prior to Admission medications   Medication Sig Start Date End Date Taking? Authorizing Provider  amoxicillin-clavulanate (AUGMENTIN) 875-125 MG tablet Take 1 tablet every 12 (twelve) hours by mouth. 12/25/16   Neva SeatGreene, Caterine Mcmeans, PA-C  traZODone (DESYREL) 50 MG tablet Take 1 tablet (50 mg total) by mouth at bedtime as needed for sleep. 11/29/12   Thermon Leylandavis, Laura A, NP    Family History Family History  Problem Relation Age of Onset  . Hypertension Father   . Diabetes Father   . Hypertension Mother   . Heart attack Neg Hx   . Hyperlipidemia Neg Hx   . Sudden death Neg Hx     Social History Social History   Tobacco Use  . Smoking status: Current Every Day Smoker    Packs/day: 0.50    Years: 10.00    Pack years: 5.00    Types: Cigarettes  . Smokeless  tobacco: Never Used  Substance Use Topics  . Alcohol use: No  . Drug use: Yes    Types: Marijuana     Allergies   Hydrocodone   Review of Systems Review of Systems The patient denies anorexia, fever, weight loss,, vision loss, decreased hearing, hoarseness, chest pain, syncope, dyspnea on exertion, peripheral edema, balance deficits, hemoptysis, abdominal pain, melena, hematochezia, severe indigestion/heartburn, hematuria, incontinence, genital sores, muscle weakness, suspicious skin lesions, transient blindness, difficulty walking, depression, unusual weight change, abnormal bleeding, enlarged lymph nodes, angioedema, and breast masses.   Physical Exam Updated Vital Signs BP 127/84   Pulse 86   Temp 98.5 F (36.9 C)   Resp 16   Ht 5\' 11"  (1.803 m)   Wt 66.2 kg (146 lb)   SpO2 100%   BMI 20.36 kg/m   Physical Exam  Constitutional: He is oriented to person, place, and time. He appears well-developed and well-nourished. No distress.  HENT:  Head: Normocephalic and atraumatic.  Right Ear: External ear and ear canal normal. There is tenderness. Tympanic membrane is injected.  Left Ear: Tympanic membrane, external ear and ear canal normal.  Nose: Nose normal. No rhinorrhea. Right sinus exhibits no maxillary sinus tenderness and no frontal sinus tenderness. Left sinus exhibits no maxillary sinus tenderness  and no frontal sinus tenderness.  Mouth/Throat: Uvula is midline and mucous membranes are normal. No trismus in the jaw. Normal dentition. No dental abscesses or uvula swelling. Oropharyngeal exudate and posterior oropharyngeal edema present. No posterior oropharyngeal erythema or tonsillar abscesses.  No submental edema, tongue not elevated, no trismus. No impending airway obstruction; Pt able to speak full sentences, swallow intact, no drooling, stridor, or tonsillar/uvula displacement. No palatal petechia  Eyes: Conjunctivae are normal.  Neck: Trachea normal, normal range of  motion and full passive range of motion without pain. Neck supple. No neck rigidity. Normal range of motion present. No Brudzinski's sign noted.  Flexion and extension of neck without pain or difficulty. Able to breath without difficulty in extension.  Cardiovascular: Normal rate and regular rhythm.  Pulmonary/Chest: Effort normal and breath sounds normal. No stridor. No respiratory distress. He has no wheezes.  Abdominal: Soft. There is no tenderness.  No obvious evidence of splenomegaly. Non ttp.   Musculoskeletal: Normal range of motion.  Lymphadenopathy:       Head (right side): No preauricular and no posterior auricular adenopathy present.       Head (left side): No preauricular and no posterior auricular adenopathy present.    He has cervical adenopathy.  Neurological: He is alert and oriented to person, place, and time.  Skin: Skin is warm and dry. No rash noted. He is not diaphoretic.  Psychiatric: He has a normal mood and affect.  Nursing note and vitals reviewed.    ED Treatments / Results  Labs (all labs ordered are listed, but only abnormal results are displayed) Labs Reviewed - No data to display  EKG  EKG Interpretation None       Radiology No results found.  Procedures Procedures (including critical care time)  Medications Ordered in ED Medications - No data to display   Initial Impression / Assessment and Plan / ED Course  I have reviewed the triage vital signs and the nursing notes.  Pertinent labs & imaging results that were available during my care of the patient were reviewed by me and considered in my medical decision making (see chart for details).     Augmentin rx for sore throat and otitis media (R).  Recommend Tylenol/Motrin for pain, plenty of fluids and rest.    Blood pressure 127/84, pulse 86, temperature 98.5 F (36.9 C), resp. rate 16, height 5\' 11"  (1.803 m), weight 66.2 kg (146 lb), SpO2 100 %.  Keith Graves has been evaluated  today in the emergency department. The appropriate screening and testing was been performed and I believe the patient to be medically stable for discharge.   Return signs and symptoms have been discussed with the patient and/or caregivers and they have voiced their understanding. The patient has agreed to follow-up with their primary care provider or the referred specialist.  Final Clinical Impressions(s) / ED Diagnoses   Final diagnoses:  Acute otitis media, unspecified otitis media type  Pharyngitis, unspecified etiology    ED Discharge Orders        Ordered    amoxicillin-clavulanate (AUGMENTIN) 875-125 MG tablet  Every 12 hours     12/25/16 1426       Marlon PelGreene, Tondalaya Perren, PA-C 12/25/16 1430    Margarita Grizzleay, Danielle, MD 12/25/16 1531

## 2017-02-09 ENCOUNTER — Encounter (HOSPITAL_BASED_OUTPATIENT_CLINIC_OR_DEPARTMENT_OTHER): Payer: Self-pay | Admitting: Adult Health

## 2017-02-09 ENCOUNTER — Other Ambulatory Visit: Payer: Self-pay

## 2017-02-09 ENCOUNTER — Emergency Department (HOSPITAL_BASED_OUTPATIENT_CLINIC_OR_DEPARTMENT_OTHER): Payer: 59

## 2017-02-09 DIAGNOSIS — Y9389 Activity, other specified: Secondary | ICD-10-CM | POA: Diagnosis not present

## 2017-02-09 DIAGNOSIS — Y998 Other external cause status: Secondary | ICD-10-CM | POA: Diagnosis not present

## 2017-02-09 DIAGNOSIS — F17210 Nicotine dependence, cigarettes, uncomplicated: Secondary | ICD-10-CM | POA: Diagnosis not present

## 2017-02-09 DIAGNOSIS — R0602 Shortness of breath: Secondary | ICD-10-CM | POA: Diagnosis not present

## 2017-02-09 DIAGNOSIS — S29019A Strain of muscle and tendon of unspecified wall of thorax, initial encounter: Secondary | ICD-10-CM | POA: Insufficient documentation

## 2017-02-09 DIAGNOSIS — T148XXA Other injury of unspecified body region, initial encounter: Secondary | ICD-10-CM | POA: Insufficient documentation

## 2017-02-09 DIAGNOSIS — W1789XA Other fall from one level to another, initial encounter: Secondary | ICD-10-CM | POA: Insufficient documentation

## 2017-02-09 DIAGNOSIS — Y929 Unspecified place or not applicable: Secondary | ICD-10-CM | POA: Diagnosis not present

## 2017-02-09 DIAGNOSIS — S3982XA Other specified injuries of lower back, initial encounter: Secondary | ICD-10-CM | POA: Diagnosis present

## 2017-02-09 DIAGNOSIS — Z79899 Other long term (current) drug therapy: Secondary | ICD-10-CM | POA: Diagnosis not present

## 2017-02-09 DIAGNOSIS — S39012A Strain of muscle, fascia and tendon of lower back, initial encounter: Secondary | ICD-10-CM | POA: Diagnosis not present

## 2017-02-09 MED ORDER — OXYCODONE-ACETAMINOPHEN 5-325 MG PO TABS
1.0000 | ORAL_TABLET | ORAL | Status: DC | PRN
  Administered 2017-02-09: 1 via ORAL
  Filled 2017-02-09: qty 1

## 2017-02-09 MED ORDER — ONDANSETRON 4 MG PO TBDP
4.0000 mg | ORAL_TABLET | Freq: Once | ORAL | Status: AC
Start: 2017-02-09 — End: 2017-02-09
  Administered 2017-02-09: 4 mg via ORAL
  Filled 2017-02-09: qty 1

## 2017-02-09 NOTE — ED Triage Notes (Signed)
PResents post fall off the back of his truck this AM at 10. HE is now having severe lumbar back pain.  Denies numbness and tingling. HE endorses spasms and difficulty taking a deep breath due to the pain. Breath sounds clear.

## 2017-02-10 ENCOUNTER — Emergency Department (HOSPITAL_BASED_OUTPATIENT_CLINIC_OR_DEPARTMENT_OTHER)
Admission: EM | Admit: 2017-02-10 | Discharge: 2017-02-10 | Disposition: A | Discharge status: Home / Self Care | Payer: 59 | Attending: Emergency Medicine | Admitting: Emergency Medicine

## 2017-02-10 ENCOUNTER — Emergency Department (HOSPITAL_BASED_OUTPATIENT_CLINIC_OR_DEPARTMENT_OTHER): Payer: 59

## 2017-02-10 DIAGNOSIS — T148XXA Other injury of unspecified body region, initial encounter: Secondary | ICD-10-CM

## 2017-02-10 DIAGNOSIS — S39012A Strain of muscle, fascia and tendon of lower back, initial encounter: Secondary | ICD-10-CM

## 2017-02-10 DIAGNOSIS — S29019A Strain of muscle and tendon of unspecified wall of thorax, initial encounter: Secondary | ICD-10-CM

## 2017-02-10 MED ORDER — CYCLOBENZAPRINE HCL 10 MG PO TABS: 10.0000 mg | ORAL_TABLET | Freq: Every day | ORAL | 0 refills | Status: AC

## 2017-02-10 MED ORDER — OXYCODONE-ACETAMINOPHEN 5-325 MG PO TABS
1.0000 | ORAL_TABLET | Freq: Once | ORAL | Status: AC
  Administered 2017-02-10: 1 via ORAL
  Filled 2017-02-10: qty 1

## 2017-02-10 MED ORDER — ONDANSETRON 4 MG PO TBDP
4.0000 mg | ORAL_TABLET | Freq: Once | ORAL | Status: AC
  Administered 2017-02-10: 4 mg via ORAL
  Filled 2017-02-10: qty 1

## 2017-02-10 NOTE — ED Notes (Signed)
Back from xray, EDP into room.

## 2017-02-10 NOTE — ED Notes (Signed)
Alert, NAD, calm, interactive, resps e/u, speaking in clear complete sentences, no dyspnea noted, skin W&D, VSS, c/o mid L & R thoracic back, and "hurts to take a deep breath" s/p fall against back, skin intact, no redness, swelling, or bruising, "percocet given earlier helped", (denies: sob, radiation, numbness, tingling, NVD, bleeding, dizziness or visual changes). [facility] at [facility].

## 2017-02-10 NOTE — Discharge Instructions (Signed)

## 2017-02-10 NOTE — ED Notes (Signed)
Pt verbalizes understanding of d/c instructions and denies any further needs at this time. 

## 2017-02-10 NOTE — ED Provider Notes (Signed)
[facility] EMERGENCY DEPARTMENT Provider Note   CSN: [id number] Arrival date & time: 02/09/17  2116     History   Chief Complaint No chief complaint on file.   HPI Keith Graves is a 32 y.o. male.  The history is provided by the patient.  Fall  This is a new problem. The current episode started 12 to 24 hours ago. The problem occurs constantly. The problem has been gradually worsening. Associated symptoms include shortness of breath. Pertinent negatives include no chest pain and no abdominal pain. The symptoms are aggravated by bending and twisting (Breathing). The symptoms are relieved by rest. He has tried nothing for the symptoms.   Patient reports that he fell off his truck this morning accidentally landed on his back.  He has had persistent pain since that time.  He reports it actually involves most of his upper back, and he reports it hurts to breathe and move No anterior chest pain, no abdominal pain. He has no weakness in his legs, no urinary or fecal incontinence is reported Past Medical History:  Diagnosis Date  . Barrett's esophagus   . Bipolar 1 disorder (HCC)   . Degenerative disc disease   . Esophageal disease   . Hernia, hiatal   . Seizures [facility](HCC)     Patient Active Problem List   Diagnosis Date Noted  . Chronic back pain 01/22/2013  . Right knee pain 01/22/2013  . Schizoaffective disorder (HCC) 11/24/2012  . Suicide attempt (HCC) 11/24/2012  . Cannabis abuse 11/24/2012    Past Surgical History:  Procedure Laterality Date  . ESOPHAGOSCOPY     biopsy  . KNEE SURGERY         Home Medications    Prior to Admission medications   Medication Sig Start Date End Date Taking? Authorizing Provider  gabapentin (NEURONTIN) 100 MG capsule Take 100 mg by mouth 3 (three) times daily.   Yes [provider]  lithium 300 MG tablet Take 300 mg by mouth 3 (three) times daily.   Yes [provider]  amoxicillin-clavulanate (AUGMENTIN)  875-125 MG tablet Take 1 tablet every 12 (twelve) hours by mouth. 12/25/16   Neva SeatGreene, Tiffany, PA-C  traZODone (DESYREL) 50 MG tablet Take 1 tablet (50 mg total) by mouth at bedtime as needed for sleep. 11/29/12   Thermon Leylandavis, Laura A, NP    Family History Family History  Problem Relation Age of Onset  . Hypertension Father   . Diabetes Father   . Hypertension Mother   . Heart attack Neg Hx   . Hyperlipidemia Neg Hx   . Sudden death Neg Hx     Social History Social History   Tobacco Use  . Smoking status: Current Every Day Smoker    Packs/day: 0.50    Years: 10.00    Pack years: 5.00    Types: Cigarettes  . Smokeless tobacco: Never Used  Substance Use Topics  . Alcohol use: No  . Drug use: Yes    Types: Marijuana     Allergies   Hydrocodone   Review of Systems Review of Systems  Constitutional: Negative for fever.  Respiratory: Positive for shortness of breath.   Cardiovascular: Negative for chest pain.  Gastrointestinal: Negative for abdominal pain.  Musculoskeletal: Positive for back pain.  Neurological: Negative for weakness.  All other systems reviewed and are negative.    Physical Exam Updated Vital Signs BP 110/76 (BP Location: Right Arm)   Pulse 66   Temp 98.8 F (37.1 C) (  Oral)   Resp 18   SpO2 100%   Physical Exam CONSTITUTIONAL: Well developed/well nourished HEAD: Normocephalic/atraumatic EYES: EOMI/PERRL ENMT: Mucous membranes moist NECK: supple no meningeal signs SPINE/BACK: Diffuse lumbar tenderness, diffuse thoracic and parathoracic tenderness, no crepitus, no step-offs.  No bruising/crepitance/stepoffs noted to spine CV: S1/S2 noted, no murmurs/rubs/gallops noted LUNGS: Lungs are clear to auscultation bilaterally, no apparent distress ABDOMEN: soft, nontender, no rebound or guarding, bowel sounds noted throughout abdomen GU:no cva tenderness NEURO: Pt is awake/alert/appropriate, moves all extremitiesx4.  No facial droop.  Equal strength  noted bilateral lower extremities EXTREMITIES: pulses normal/equal, full ROM SKIN: warm, color normal PSYCH: Anxious  ED Treatments / Results  Labs (all labs ordered are listed, but only abnormal results are displayed) Labs Reviewed - No data to display  EKG  EKG Interpretation None       Radiology Dg Chest 2 View  Result Date: 02/10/2017 CLINICAL DATA:  Initial evaluation for acute trauma, fall. Mid back pain. EXAM: CHEST  2 VIEW COMPARISON:  Prior radiograph from 05/28/2004. FINDINGS: The cardiac and mediastinal silhouettes are stable in size and contour, and remain within normal limits. The lungs are normally inflated. No airspace consolidation, pleural effusion, or pulmonary edema is identified. There is no pneumothorax. No acute osseous abnormality identified. IMPRESSION: No active cardiopulmonary disease. Electronically Signed   By: Rise Mu M.D.   On: 02/10/2017 01:20   Dg Lumbar Spine 2-3 Views  Result Date: 02/09/2017 CLINICAL DATA:  Patient fell off back of truck this morning and presents with severe back pain. EXAM: LUMBAR SPINE - 2-3 VIEW COMPARISON:  Lumbar spine MRI 12/29/2011 FINDINGS: There is no evidence of lumbar spine fracture. Normal lumbar segmentation. Alignment is normal. Intervertebral disc spaces are maintained. IMPRESSION: No acute fracture or posttraumatic listhesis. Electronically Signed   By: Tollie Eth M.D.   On: 02/09/2017 22:23    Procedures Procedures (including critical care time)  Medications Ordered in ED Medications  oxyCODONE-acetaminophen (PERCOCET/ROXICET) 5-325 MG per tablet 1 tablet (1 tablet Oral Given 02/09/17 2142)  ondansetron (ZOFRAN-ODT) disintegrating tablet 4 mg (4 mg Oral Given 02/09/17 2141)  oxyCODONE-acetaminophen (PERCOCET/ROXICET) 5-325 MG per tablet 1 tablet (1 tablet Oral Given 02/10/17 0056)  ondansetron (ZOFRAN-ODT) disintegrating tablet 4 mg (4 mg Oral Given 02/10/17 0055)     Initial Impression / Assessment and  Plan / ED Course  I have reviewed the triage vital signs and the nursing notes. Narcotic database reviewed and considered in decision making  Pertinent  imaging results that were available during my care of the patient were reviewed by me and considered in my medical decision making (see chart for details).     Somewhat improved, he is able to ambulate, there is no focal weakness in his leg Suspect mostly muscle strain at this time, advised heat, advised rest, will add on Flexeril. He requests referral to spine specialist We discussed strict return precautions No signs of acute thoracic or lumbar fracture  Final Clinical Impressions(s) / ED Diagnoses   Final diagnoses:  Muscle strain  Strain of lumbar region, initial encounter  Thoracic myofascial strain, initial encounter    ED Discharge Orders        Ordered    cyclobenzaprine (FLEXERIL) 10 MG tablet  Daily at bedtime     02/10/17 0136       Zadie Rhine, MD 02/10/17 [phone]
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
